# Patient Record
Sex: Female | Born: 1995 | Race: Black or African American | Hispanic: No | Marital: Single | Smoking: Never smoker
Health system: Southern US, Community
[De-identification: ages and names within clinical notes are randomized; demographics above are authoritative.]

## PROBLEM LIST (undated history)

## (undated) DIAGNOSIS — J45909 Unspecified asthma, uncomplicated: Secondary | ICD-10-CM

## (undated) HISTORY — PX: WISDOM TOOTH EXTRACTION: SHX21

---
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
## 2015-09-22 ENCOUNTER — Emergency Department (HOSPITAL_COMMUNITY): Payer: Medicaid Other

## 2015-09-22 ENCOUNTER — Encounter (HOSPITAL_COMMUNITY): Payer: Self-pay | Admitting: Emergency Medicine

## 2015-09-22 ENCOUNTER — Emergency Department (HOSPITAL_COMMUNITY)
Admission: EM | Admit: 2015-09-22 | Discharge: 2015-09-22 | Disposition: A | Discharge status: Home / Self Care | Payer: Medicaid Other | Attending: Emergency Medicine | Admitting: Emergency Medicine

## 2015-09-22 DIAGNOSIS — Z793 Long term (current) use of hormonal contraceptives: Secondary | ICD-10-CM | POA: Insufficient documentation

## 2015-09-22 DIAGNOSIS — Z79899 Other long term (current) drug therapy: Secondary | ICD-10-CM | POA: Insufficient documentation

## 2015-09-22 DIAGNOSIS — R079 Chest pain, unspecified: Secondary | ICD-10-CM | POA: Insufficient documentation

## 2015-09-22 LAB — CBC
HCT: 37.6 % (ref 36.0–46.0)
Hemoglobin: 12 g/dL (ref 12.0–15.0)
MCH: 27.6 pg (ref 26.0–34.0)
MCHC: 31.9 g/dL (ref 30.0–36.0)
MCV: 86.4 fL (ref 78.0–100.0)
PLATELETS: 263 10*3/uL (ref 150–400)
RBC: 4.35 MIL/uL (ref 3.87–5.11)
RDW: 13 % (ref 11.5–15.5)
WBC: 7.1 10*3/uL (ref 4.0–10.5)

## 2015-09-22 LAB — I-STAT TROPONIN, ED: Troponin i, poc: 0 ng/mL (ref 0.00–0.08)

## 2015-09-22 LAB — BASIC METABOLIC PANEL
Anion gap: 7 (ref 5–15)
BUN: 8 mg/dL (ref 6–20)
CALCIUM: 9.1 mg/dL (ref 8.9–10.3)
CO2: 25 mmol/L (ref 22–32)
Chloride: 104 mmol/L (ref 101–111)
Creatinine, Ser: 0.74 mg/dL (ref 0.44–1.00)
GFR calc non Af Amer: >60 mL/min (ref >60)
GLUCOSE: 84 mg/dL (ref 65–99)
Potassium: 3.6 mmol/L (ref 3.5–5.1)
Sodium: 136 mmol/L (ref 135–145)

## 2015-09-22 LAB — D-DIMER, QUANTITATIVE: D-Dimer, Quant: 0.5 ug/mL-FEU (ref 0.00–0.50)

## 2015-09-22 NOTE — ED Provider Notes (Signed)
Complaint of anterior chest pain sharp in quality left-sided parasternal onset yesterday, he replied "heavy breathing.." And intermittent "palpitations" Nothing makes symptoms better or worse. No other associated symptoms. Presently asymptomatic except breathing feels "slightly "heavy" on exam no distress lungs clear auscultation heart regular rhythm no murmurs rubs extremities no edema.  Doug Sou, MD 09/22/15 [phone]

## 2015-09-22 NOTE — Progress Notes (Signed)
Entered in d/c instructions [facility] This is your assigned Medicaid [place] access doctor If you prefer another contact DSS [phone] DSS assigned your doctor *You may receive a bill if you go to any family Dr not assigned to you Medicaid [place] access pcp is [facility] [address] [place], [place] [address] [phone] Medicaid [place] Access Covered Patient If you plan to become a resident of [place] call: [phone] or go to [address] [place], [place] [address] [email or web address] Use this website to assist with understanding your coverage & to renew application As a Medicaid client you MUST contact DSS/SSI each time you change address, move to another  county or another state to keep your address updated [place] Medicaid Transportation to Dr appts if you are have full Medicaid: [phone], [phone]

## 2015-09-22 NOTE — ED Notes (Signed)
Spoke with main lab and they have a saved blue top to run dimer.

## 2015-09-22 NOTE — Discharge Instructions (Signed)
Follow up with your primary care provider in the next 4-5 days for follow up. Please return to the Emergency Department if symptoms worsen or new onset of fever, headache, lightheadedness, dizziness, visual changes, difficulty breathing, palpitations, chest pain, productive cough, vomiting, numbness, tingling, weakness.

## 2015-09-22 NOTE — ED Notes (Signed)
Patient presents for non radiating centralized CP x1 day, described as sharp in nature, rates pain 5/10. No alleviating symptoms. Denies N/V, diaphoresis, lightheadedness or dizziness, numbness of tingling.

## 2015-09-22 NOTE — ED Notes (Signed)
Requested Kathleen Arellano  to inquiry in additional lab was drawn for d imer. Kathleen Arellano will check

## 2015-09-22 NOTE — ED Provider Notes (Signed)
CSN: [id number]     Arrival date & time 09/22/15  1304 History   First MD Initiated Contact with Patient 09/22/15 1556     Chief Complaint  Patient presents with  . Chest Pain     (Consider location/radiation/quality/duration/timing/severity/associated sxs/prior Treatment) HPI   Patient is a 20 year old female with hx of asthma who presents to the ED with complaint of chest pain, onset yesterday. Patient reports when she woke up yesterday morning she noticed she had sharp chest pain to her mid sternal chest wall, denies radiation, denies any aggravating or alleviating factors. She notes her pain started to resolve throughout the day but then notes it returned last night and she noticed it again this morning when she woke up. She also reports having intermittent chest tightness. Denies fever, chills, body aches, diaphoresis, nasal congestion, sore throat, cough, wheezing, palpitations, abdominal pain, nausea, vomiting numbness, tingling or weakness, lightheadedness, dizziness. Patient denies taking any medications prior to arrival. 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 Patient notes her chest pain has resolved since arrival to the ED. Patient notes she has been worked up in the past by a cardiologist at home for palpitations, notes that she wore a cardiac monitor and was advised that her monitor readings where unremarkable. Pt reports her mother has been worked up for palpitations in the past but is currently not on any medications, denies other family hx of heart disease.  History reviewed. No pertinent past medical history. History reviewed. No pertinent past surgical history. No family history on file. Social History  Substance Use Topics  . Smoking status: Never Smoker   . Smokeless tobacco: None  . Alcohol Use: No   OB History    No data available     Review of Systems  Respiratory: Positive for  chest tightness.   Cardiovascular: Positive for chest pain.  All other systems reviewed and are negative.     Allergies  Review of patient's allergies indicates no known allergies.  Home Medications   Prior to Admission medications   Medication Sig Start Date End Date Taking? Authorizing Provider  albuterol (PROVENTIL HFA;VENTOLIN HFA) 108 (90 BASE) MCG/ACT inhaler Inhale 1-2 puffs into the lungs every 6 (six) hours as needed for wheezing or shortness of breath.   Yes Historical Provider, MD  ibuprofen (ADVIL,MOTRIN) 200 MG tablet Take 400 mg by mouth every 6 (six) hours as needed for headache, mild pain or moderate pain.   Yes Historical Provider, MD  norgestimate-ethinyl estradiol (ORTHO-CYCLEN,SPRINTEC,PREVIFEM) 0.25-35 MG-MCG tablet Take 1 tablet by mouth daily.   Yes Historical Provider, MD   BP 114/67 mmHg  Pulse 69  Temp(Src) 98.4 F (36.9 C) (Oral)  Resp 16  Ht  (1.651 m)  Wt 72.576 kg  BMI 26.63 kg/m2  SpO2 100%  LMP 08/14/2015 (Approximate) Physical Exam  Constitutional: She is oriented to person, place, and time. She appears well-developed and well-nourished. No distress.  HENT:  Head: Normocephalic and atraumatic.  Mouth/Throat: Oropharynx is clear and moist. No oropharyngeal exudate.  Eyes: Conjunctivae and EOM are normal. Pupils are equal, round, and reactive to light. Right eye exhibits no discharge. Left eye exhibits no discharge. No scleral icterus.  Neck: Normal range of motion. Neck supple.  Cardiovascular: Normal rate, regular rhythm, normal heart sounds and intact distal pulses.   Pulmonary/Chest: Effort normal and breath sounds normal. No respiratory distress. She has no wheezes.  She has no rales. She exhibits no tenderness.  Abdominal: Soft. Bowel sounds are normal. She exhibits no distension and no mass. There is no tenderness. There is no rebound and no guarding.  Musculoskeletal: Normal range of motion. She exhibits no edema.  Lymphadenopathy:     She has no cervical adenopathy.  Neurological: She is alert and oriented to person, place, and time.  Skin: Skin is warm and dry. She is not diaphoretic.  Nursing note and vitals reviewed.   ED Course  Procedures (including critical care time) Labs Review Labs Reviewed  BASIC METABOLIC PANEL  CBC  D-DIMER, QUANTITATIVE (NOT AT [facility])  Rosezena Sensor, ED    Imaging Review Dg Chest 2 View  09/22/2015  CLINICAL DATA:  Chest pain, heavy breathing starting yesterday, history of exercise induced asthma EXAM: CHEST  2 VIEW COMPARISON:  None. FINDINGS: The heart size and mediastinal contours are within normal limits. Both lungs are clear. The visualized skeletal structures are unremarkable. IMPRESSION: No active cardiopulmonary disease. Electronically Signed   By: Natasha Mead M.D.   On: 09/22/2015 14:14   I have personally reviewed and evaluated these images and lab results as part of my medical decision-making.   EKG Interpretation   Date/Time:  Thursday September 22 2015 13:28:56 EST Ventricular Rate:  78 PR Interval:  171 QRS Duration: 70 QT Interval:  359 QTC Calculation: 409 R Axis:   91 Text Interpretation:  Sinus rhythm Borderline right axis deviation  Baseline wander in lead(s) I aVR No old tracing to compare Confirmed by  Ethelda Chick  MD, SAM [phone]) on 09/22/2015 4:53:48 PM      MDM   Final diagnoses:  Chest pain, unspecified chest pain type    Patient presents with midsternal chest pain. Patient reports her chest pain has resolved since arrival to the ED. VSS. Exam unremarkable. EKG shows sinus rhythm, no prior tracings to compare. Troponin negative. Labs unremarkable. Pt reports she is on OCPs, unable to [facility], will order d-dimer to evaluate for PE. D-dimer negative. HEART score 1. I have a low suspicion for ACS, PE, dissection, or other acute cardiac event at this time and do not feel any further workup or imaging is warranted. Discussed results and plan for discharge with  patient. Advised patient to follow up with her cardiologist when she is home next week.  Evaluation does not show pathology requring ongoing emergent intervention or admission. Pt is hemodynamically stable and mentating appropriately. Discussed findings/results and plan with patient/guardian, who agrees with plan. All questions answered. Return precautions discussed and outpatient follow up given.        Satira Sark [place], [place] 09/22/15 1804  Doug Sou, MD 09/23/15 [phone]

## 2016-08-21 ENCOUNTER — Ambulatory Visit (HOSPITAL_COMMUNITY)
Admission: EM | Admit: 2016-08-21 | Discharge: 2016-08-21 | Disposition: A | Discharge status: Home / Self Care | Payer: Medicaid Other | Attending: Family Medicine | Admitting: Family Medicine

## 2016-08-21 ENCOUNTER — Encounter (HOSPITAL_COMMUNITY): Payer: Self-pay | Admitting: Emergency Medicine

## 2016-08-21 DIAGNOSIS — B9789 Other viral agents as the cause of diseases classified elsewhere: Secondary | ICD-10-CM | POA: Diagnosis not present

## 2016-08-21 DIAGNOSIS — R110 Nausea: Secondary | ICD-10-CM | POA: Diagnosis not present

## 2016-08-21 DIAGNOSIS — H6123 Impacted cerumen, bilateral: Secondary | ICD-10-CM | POA: Diagnosis not present

## 2016-08-21 DIAGNOSIS — J069 Acute upper respiratory infection, unspecified: Secondary | ICD-10-CM | POA: Diagnosis not present

## 2016-08-21 HISTORY — DX: Unspecified asthma, uncomplicated: J45.909

## 2016-08-21 MED ORDER — ONDANSETRON 4 MG PO TBDP: 4.0000 mg | ORAL_TABLET | Freq: Three times a day (TID) | ORAL | 0 refills | Status: AC | PRN

## 2016-08-21 NOTE — Discharge Instructions (Signed)
You most likely have a viral URI, I advise rest, plenty of fluids and management of symptoms with over the counter medicines. For symptoms you may take Tylenol as needed every 4-6 hours for body aches or fever, not to exceed 4,000 mg a day, Take mucinex or mucinex DM ever 12 hours with a full glass of water, you may use an inhaled steroid such as Flonase, 2 sprays each nostril once a day for congestion, or an antihistamine such as Claritin or Zyrtec once a day. Should your symptoms worsen or fail to resolve, follow up with your primary care provider or return to clinic.   In addition to the instructions listed above, I have prescribed Zofran for nausea, take one tablet under the tongue every 8 hours as needed for nausea.

## 2016-08-21 NOTE — ED Provider Notes (Signed)
CSN: [id number]     Arrival date & time 08/21/16  1004 History   First MD Initiated Contact with Patient 08/21/16 1058     Chief Complaint  Patient presents with  . Headache  . Sore Throat   (Consider location/radiation/quality/duration/timing/severity/associated sxs/prior Treatment) 21 year old female persents to clinic with 24 hour complaint of headache, nausea, and sore throat, along with headache. Headache is bilateral, no light or sound sensitivity, dull and achy, No body aches, no muscle aches, no fever, vomiting, or diarrhea.   The history is provided by the patient.  Headache  Sore Throat  Associated symptoms include headaches.    Past Medical History:  Diagnosis Date  . Asthma    exercise induced   Past Surgical History:  Procedure Laterality Date  . WISDOM TOOTH EXTRACTION Bilateral    History reviewed. No pertinent family history. Social History  Substance Use Topics  . Smoking status: Never Smoker  . Smokeless tobacco: Never Used  . Alcohol use No   OB History    No data available     Review of Systems  Reason unable to perform ROS: as covered in HPI.  Neurological: Positive for headaches.  All other systems reviewed and are negative.   Allergies  Patient has no known allergies.  Home Medications   Prior to Admission medications   Medication Sig Start Date End Date Taking? Authorizing Provider  albuterol (PROVENTIL HFA;VENTOLIN HFA) 108 (90 BASE) MCG/ACT inhaler Inhale 1-2 puffs into the lungs every 6 (six) hours as needed for wheezing or shortness of breath.   Yes Historical Provider, MD  ibuprofen (ADVIL,MOTRIN) 200 MG tablet Take 400 mg by mouth every 6 (six) hours as needed for headache, mild pain or moderate pain.   Yes Historical Provider, MD  norgestimate-ethinyl estradiol (ORTHO-CYCLEN,SPRINTEC,PREVIFEM) 0.25-35 MG-MCG tablet Take 1 tablet by mouth daily.   Yes Historical Provider, MD  ondansetron (ZOFRAN ODT) 4 MG disintegrating tablet Take 1  tablet (4 mg total) by mouth every 8 (eight) hours as needed for nausea or vomiting. 08/21/16   Dorena Bodo, NP   Meds Ordered and Administered this Visit  Medications - No data to display  BP 109/71 (BP Location: Right Arm)   Pulse 76   Temp 98.3 F (36.8 C) (Oral)   LMP 08/18/2016 (Exact Date)   SpO2 99%  No data found.   Physical Exam  Constitutional: She is oriented to person, place, and time. She appears well-developed and well-nourished. She does not have a sickly appearance. She does not appear ill. No distress.  HENT:  Head: Normocephalic and atraumatic.  Right Ear: External ear normal.  Left Ear: External ear normal.  Nose: Nose normal. Right sinus exhibits no maxillary sinus tenderness and no frontal sinus tenderness. Left sinus exhibits no maxillary sinus tenderness and no frontal sinus tenderness.  Mouth/Throat: Uvula is midline, oropharynx is clear and moist and mucous membranes are normal. No oropharyngeal exudate, posterior oropharyngeal edema or posterior oropharyngeal erythema. Tonsils are 2+ on the right. Tonsils are 2+ on the left. No tonsillar exudate.  Bilateral cerumen impaction  Eyes: Pupils are equal, round, and reactive to light.  Neck: Normal range of motion. Neck supple. No JVD present.  Cardiovascular: Normal rate and regular rhythm.   Pulmonary/Chest: Effort normal and breath sounds normal. No respiratory distress. She has no wheezes.  Abdominal: Soft. Bowel sounds are normal. She exhibits no distension. There is no tenderness. There is no guarding.  Lymphadenopathy:    She has no  cervical adenopathy.  Neurological: She is alert and oriented to person, place, and time.  Skin: Skin is warm and dry. Capillary refill takes less than 2 seconds. She is not diaphoretic.  Psychiatric: She has a normal mood and affect.  Nursing note and vitals reviewed.   Urgent Care Course     Procedures (including critical care time)  Labs Review Labs Reviewed - No  data to display  Imaging Review No results found.   Visual Acuity Review  Right Eye Distance:   Left Eye Distance:   Bilateral Distance:    Right Eye Near:   Left Eye Near:    Bilateral Near:         MDM   1. Viral URI   2. Nausea   3. Bilateral impacted cerumen    Ears reevaluated after cleaning, TM normal, no signs of infection  You most likely have a viral URI, I advise rest, plenty of fluids and management of symptoms with over the counter medicines. For symptoms you may take Tylenol as needed every 4-6 hours for body aches or fever, not to exceed 4,000 mg a day, Take mucinex or mucinex DM ever 12 hours with a full glass of water, you may use an inhaled steroid such as Flonase, 2 sprays each nostril once a day for congestion, or an antihistamine such as Claritin or Zyrtec once a day. Should your symptoms worsen or fail to resolve, follow up with your primary care provider or return to clinic.   In addition to the instructions listed above, I have prescribed Zofran for nausea, take one tablet under the tongue every 8 hours as needed for nausea.      Dorena BodoLawrence Lou Irigoyen, NP 08/21/16 1122

## 2016-08-21 NOTE — ED Triage Notes (Signed)
Pt has been suffering from a headache since Sunday that has not been relieved by 600 mg of ibuprofen.  She reports a sore throat that started yesterday.  She denies any fever, or any URI symptoms.

## 2016-09-05 ENCOUNTER — Ambulatory Visit: Payer: Medicaid Other | Attending: Medical | Admitting: Physical Therapy

## 2016-09-05 ENCOUNTER — Encounter: Payer: Self-pay | Admitting: Physical Therapy

## 2016-09-05 DIAGNOSIS — M25562 Pain in left knee: Secondary | ICD-10-CM | POA: Insufficient documentation

## 2016-09-05 DIAGNOSIS — M6281 Muscle weakness (generalized): Secondary | ICD-10-CM | POA: Diagnosis present

## 2016-09-05 DIAGNOSIS — M25561 Pain in right knee: Secondary | ICD-10-CM | POA: Diagnosis present

## 2016-09-05 DIAGNOSIS — M5442 Lumbago with sciatica, left side: Secondary | ICD-10-CM | POA: Insufficient documentation

## 2016-09-05 DIAGNOSIS — G8929 Other chronic pain: Secondary | ICD-10-CM | POA: Diagnosis present

## 2016-09-07 NOTE — Therapy (Addendum)
[facility] [address] [place], [place], [address] Phone: [phone]   Fax:  [phone]  Physical Therapy Evaluation  Patient Details  Name: Kathleen Arellano MRN: [id number] Date of Birth: [birth year] Referring Provider: Marin Olp [facility]   Encounter Date: 09/05/2016      PT End of Session - 09/10/16 2028    Visit Number 2   Number of Visits 16   Date for PT Re-Evaluation 10/31/16   Authorization Type medicaid    PT Start Time 0802   PT Stop Time 0845   PT Time Calculation (min) 43 min   Activity Tolerance Patient tolerated treatment well   Behavior During Therapy [facility] for tasks assessed/performed      Past Medical History:  Diagnosis Date  . Asthma    exercise induced    Past Surgical History:  Procedure Laterality Date  . WISDOM TOOTH EXTRACTION Bilateral     There were no vitals filed for this visit.       Subjective Assessment - 09/10/16 0813    Subjective Patient reports  her lower back has been about the same. Her pain level is about a 6-7/10. She has been oding her exercises. She continues to have pain in her knees as well.    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 No limit    How long can you walk comfortably? pain with community distances in the knees and back when the back is exacerbated    Diagnostic tests x-ray knee: Nothing significant per patient    Patient Stated Goals To have less pain    Currently in Pain? Yes   Pain Score 8    Pain Location Back   Pain Orientation Left   Pain Descriptors / Indicators Aching   Pain Type Chronic pain   Pain Onset More than a month ago   Pain Frequency Intermittent   Aggravating Factors  running and walking    Pain Relieving Factors rest   Effect of Pain on Daily Activities difficulty going to the gym    Pain Score 6   Pain Location Knee   Pain Orientation Left;Mid   Pain Descriptors / Indicators Aching   Pain Type Chronic pain   Pain Onset More than a month ago   Pain Frequency Intermittent   Aggravating Factors  standing, walking    Pain Relieving Factors rest   Effect of Pain on Daily Activities difficulty walking around the campus                                PT Education - 09/10/16 2024    Education provided Yes   Education Details symptom mangement    Person(s) Educated Patient   Methods Explanation;Demonstration   Comprehension Verbalized understanding;Returned demonstration;Verbal cues required;Tactile cues required          PT Short Term Goals - 09/05/16 1039      PT SHORT TERM GOAL #1   Title Patient will demonstrate good core strength    Baseline fair strength    Time 4   Period Weeks   Status New     PT SHORT TERM GOAL #2   Title Patient will be independent with Mconnell taping if effective    Baseline unknown    Time 4   Period Weeks   Status New  PT SHORT TERM GOAL #3   Title Patient will be independent with initial HEP    Baseline unknown, given basic on eval    Time 4   Period Weeks   Status New     PT SHORT TERM GOAL #4   Title Patient will demonstrate 5/5 gross bilateral hip strength    Baseline 4/5    Time 4   Period Weeks   Status New           PT Long Term Goals - 09/06/16 1656      PT LONG TERM GOAL #1   Title Patient will return to the gym with a program for strengthening and stretching    Baseline unable, unknown    Time 8   Period Weeks   Status New     PT LONG TERM GOAL #2   Title Patient will demonstrate a 38% limitation on FOTO    Baseline 57%   Time 8   Period Weeks   Status New     PT LONG TERM GOAL #3   Title Patient will walk 10 min without increased pain    Baseline Unable to walk 10 min without pain currently    Time 8   Period Weeks   Status New               Plan - 09/10/16 2029    Clinical Impression Statement Patient  countinued to have in her lower back with minimal activity. therapy tried multiple mobilizations but could not get a cavitation. Therapy reviewed taping with the patient to improved patellar stabilization. She hasd pain with posterior hip mobilization but improved pain free flexion.    PT Frequency 2x / week   PT Duration 8 weeks   PT Treatment/Interventions ADLs/Self Care Home Management;Cryotherapy;Electrical Stimulation;Iontophoresis 4mg /ml Dexamethasone;Stair training;Gait training;Moist Heat;Ultrasound;Traction;Therapeutic activities;Therapeutic exercise;Neuromuscular re-education;Patient/family education;Passive range of motion;Manual techniques;Dry needling;Taping   PT Next Visit Plan Give patient bridging, SLR 3 way, SAQ, try mconnel taping both knees, soft tissue mobilization to the left upper glut/ lumbar spine. Has some pisnching in the front ogf the left hip with flexion. Patient may require posteriro or anmterior hip mobilizations. Consdier posterior pelvic tilt. Assess tolerance to mobilizations and patellar taping    PT Home Exercise Plan had pain lyin on her back: sidelying clam, prayer stretch and lateral prayer piriformis / glut set    Consulted and Agree with Plan of Care Patient      Patient will benefit from skilled therapeutic intervention in order to improve the following deficits and impairments:  Pain, Decreased activity tolerance, Decreased strength, Increased muscle spasms  Visit Diagnosis: Chronic left-sided low back pain with left-sided sciatica - Plan: PT plan of care cert/re-cert  Chronic pain of left knee - Plan: PT plan of care cert/re-cert  Chronic pain of right knee - Plan: PT plan of care cert/re-cert  Muscle weakness (generalized) - Plan: PT plan of care cert/re-cert     Problem List There are no active problems to display for this patient.   PAA,JENNIFER PT DPT  09/11/2016, 11:59 AM  [facility] [address] [place], [place], [id number] Phone: [phone]   Fax:  [phone]  Name: Kathleen Arellano MRN: [id number] Date of Birth: [birth year]

## 2016-09-10 ENCOUNTER — Encounter: Payer: Self-pay | Admitting: Physical Therapy

## 2016-09-10 ENCOUNTER — Ambulatory Visit: Payer: Medicaid Other | Admitting: Physical Therapy

## 2016-09-10 DIAGNOSIS — M6281 Muscle weakness (generalized): Secondary | ICD-10-CM

## 2016-09-10 DIAGNOSIS — M25562 Pain in left knee: Secondary | ICD-10-CM

## 2016-09-10 DIAGNOSIS — M25561 Pain in right knee: Secondary | ICD-10-CM

## 2016-09-10 DIAGNOSIS — G8929 Other chronic pain: Secondary | ICD-10-CM

## 2016-09-10 DIAGNOSIS — M5442 Lumbago with sciatica, left side: Principal | ICD-10-CM

## 2016-09-10 NOTE — Therapy (Signed)
[facility] [address] [place], [place], [address] Phone: [phone]   Fax:  [phone]  Physical Therapy Treatment  Patient Details  Name: Kathleen Arellano MRN: [id number] Date of Birth: 08/04/1995 Referring Provider: Marin Olp [facility]   Encounter Date: 09/10/2016      PT End of Session - 09/10/16 2028    Visit Number 2   Number of Visits 16   Date for PT Re-Evaluation 10/31/16   Authorization Type medicaid    PT Start Time 0802   PT Stop Time 0845   PT Time Calculation (min) 43 min   Activity Tolerance Patient tolerated treatment well   Behavior During Therapy [facility] for tasks assessed/performed      Past Medical History:  Diagnosis Date  . Asthma    exercise induced    Past Surgical History:  Procedure Laterality Date  . WISDOM TOOTH EXTRACTION Bilateral     There were no vitals filed for this visit.      Subjective Assessment - 09/10/16 0813    Subjective Patient reports  her lower back has been about the same. Her pain level is about a 6-7/10. She has been oding her exercises. She continues to have pain in her knees as well.    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 No limit    How long can you walk comfortably? pain with community distances in the knees and back when the back is exacerbated    Diagnostic tests x-ray knee: Nothing significant per patient    Patient Stated Goals To have less pain    Currently in Pain? Yes   Pain Score 8    Pain Location Back   Pain Orientation Left   Pain Descriptors / Indicators Aching   Pain Type Chronic pain   Pain Onset More than a month ago   Pain Frequency Intermittent   Aggravating Factors  running and walking    Pain Relieving Factors rest   Effect of Pain on Daily Activities difficulty going to the gym    Pain Score 6   Pain Location Knee   Pain Orientation Left;Mid   Pain  Descriptors / Indicators Aching   Pain Type Chronic pain   Pain Onset More than a month ago   Pain Frequency Intermittent   Aggravating Factors  standing, walking    Pain Relieving Factors rest   Effect of Pain on Daily Activities difficulty walking around the campus                          [facility] Adult PT Treatment/Exercise - 09/10/16 0001      Self-Care   Self-Care Other Self-Care Comments   Other Self-Care Comments  reviewed mconnell taping. patint able to perfrom taping with min cuing after therapy demsotrates on her right knee.      Lumbar Exercises: Supine   Other Supine Lumbar Exercises Piriformis stretch held 2nd to pinching feeling in her hip.    Other Supine Lumbar Exercises Bridging2x10 with cuing for abdominal brace; Staight lef raise 2x10 bilateral;     Knee/Hip Exercises: Seated   Long Arc Quad Limitations seated LAQ yellow 2x10 in range without crepitus      Manual Therapy   Manual therapy comments Posterior hip mobilization. Difficult to find position without pain. Hip grade 5 mobilization 2x with no cavitation. Grade 3 and 4  long axis mobilization with foot external rotation, shotgun mobilization.                 PT Education - 09/10/16 2024    Education provided Yes   Education Details symptom mangement    Person(s) Educated Patient   Methods Explanation;Demonstration   Comprehension Verbalized understanding;Returned demonstration;Verbal cues required;Tactile cues required          PT Short Term Goals - 09/05/16 1039      PT SHORT TERM GOAL #1   Title Patient will demsotrate good core strength    Time 4   Period Weeks   Status New     PT SHORT TERM GOAL #2   Title Patient will be indepdendent with Mconnel taping if effective    Time 4   Period Weeks   Status New     PT SHORT TERM GOAL #3   Title Patient will be independent with initial HEP    Time 4   Period Weeks   Status New     PT SHORT TERM GOAL #4   Title  Patient will demsotrate 5/5 gross bilateral hip strength    Time 4   Period Weeks   Status New           PT Long Term Goals - 09/06/16 1656      PT LONG TERM GOAL #1   Title Patient will return to the gym with a program for strengthening and stretching    Time 8   Period Weeks   Status New     PT LONG TERM GOAL #2   Title Patient will demonstrate a 38% limitation on FOTO    Time 8   Period Weeks   Status New     PT LONG TERM GOAL #3   Title Patient will walk 10 miles without increased pain    Time 8   Period Weeks   Status New               Plan - 09/10/16 2029    Clinical Impression Statement Patient countinued to have in her lower back with minimal activity. therapy tried multiple mobilizations but could not get a cavitation. Therapy reviewed taping with the patient to improved patellar stabilization. She hasd pain with posterior hip mobilization but improved pain free flexion.    PT Frequency 2x / week   PT Duration 8 weeks   PT Treatment/Interventions ADLs/Self Care Home Management;Cryotherapy;Electrical Stimulation;Iontophoresis 4mg /ml Dexamethasone;Stair training;Gait training;Moist Heat;Ultrasound;Traction;Therapeutic activities;Therapeutic exercise;Neuromuscular re-education;Patient/family education;Passive range of motion;Manual techniques;Dry needling;Taping   PT Next Visit Plan Give patient bridging, SLR 3 way, SAQ, try mconnel taping both knees, soft tissue mobilization to the left upper glut/ lumbar spine. Has some pisnching in the front ogf the left hip with flexion. Patient may require posteriro or anmterior hip mobilizations. Consdier posterior pelvic tilt. Assess tolerance to mobilizations and patellar taping    PT Home Exercise Plan had pain lyin on her back: sidelying clam, prayer stretch and lateral prayer piriformis / glut set    Consulted and Agree with Plan of Care Patient      Patient will benefit from skilled therapeutic intervention in order  to improve the following deficits and impairments:  Pain, Decreased activity tolerance, Decreased strength, Increased muscle spasms  Visit Diagnosis: Chronic left-sided low back pain with left-sided sciatica  Chronic pain of left knee  Chronic pain of right knee  Muscle weakness (generalized)     Problem List There are no active problems  to display for this patient.   Dessie Coma PT DPT  09/10/2016, 8:39 PM  [facility] [address] [place], [place], [address] Phone: [phone]   Fax:  [phone]  Name: Kathleen Arellano MRN: [id number] Date of Birth: [birth year]

## 2016-09-12 ENCOUNTER — Ambulatory Visit: Payer: Medicaid Other | Admitting: Physical Therapy

## 2016-09-12 DIAGNOSIS — M5442 Lumbago with sciatica, left side: Secondary | ICD-10-CM | POA: Diagnosis not present

## 2016-09-12 DIAGNOSIS — G8929 Other chronic pain: Secondary | ICD-10-CM

## 2016-09-12 DIAGNOSIS — M6281 Muscle weakness (generalized): Secondary | ICD-10-CM

## 2016-09-12 DIAGNOSIS — M25562 Pain in left knee: Secondary | ICD-10-CM

## 2016-09-12 DIAGNOSIS — M25561 Pain in right knee: Secondary | ICD-10-CM

## 2016-09-12 NOTE — Therapy (Signed)
[place], [place], [address] Phone: [phone]   Fax:  [phone]  Physical Therapy Treatment  Patient Details  Name: Valincia Touch MRN: [id number] Date of Birth: [birth year] Referring Provider: [facility] L[facility]   Encounter Date: 09/12/2016      PT End of Session - 09/12/16 0901    Visit Number 3   Number of Visits 16   Date for PT Re-Evaluation 10/31/16   PT Start Time 0802   PT Stop Time 0900   PT Time Calculation (min) 58 min   Activity Tolerance Patient tolerated treatment well   Behavior During Therapy [facility] for tasks assessed/performed      Past Medical History:  Diagnosis Date  . Asthma    exercise induced    Past Surgical History:  Procedure Laterality Date  . WISDOM TOOTH EXTRACTION Bilateral     There were no vitals filed for this visit.      Subjective Assessment - 09/12/16 1150    Subjective Patient reports  her lower back has been about the same. Her pain level is about a 6-7/10. She has been doing her exercises. She continues to have pain in her knees as well.   tape is helpful.  I want to look on [employer] to see If I have picked the correct tape.    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 No limit    How long can you walk comfortably? pain with community distances in the knees and back when the back is exacerbated    Diagnostic tests x-ray knee: Nothing significant per patient    Patient Stated Goals To have less pain    Currently in Pain? Yes   Pain Score 7    Pain Location Back   Pain Orientation Left   Pain Type Chronic pain   Pain Onset More than a month ago   Pain Frequency Intermittent   Aggravating Factors  running walking moving certain ways   Pain Relieving Factors rest, change of position   Effect of Pain on Daily Activities difficulty with gym workout   Pain Score 7   Pain  Location Knee   Pain Orientation Right;Mid   Pain Descriptors / Indicators Aching  pops   Pain Type Chronic pain   Aggravating Factors  standing walking   Pain Relieving Factors rest   Effect of Pain on Daily Activities difficulty walking around campus                         [facility] Adult PT Treatment/Exercise - 09/12/16 0001      Self-Care   Other Self-Care Comments  correct tape info on [employer],  education anatomy knee,  ADL handout for posture     Lumbar Exercises: Sidelying   Clam Limitations yellow band up to 2 sets of 10.  cued  for position.  Difficult to do when fatigued.      Lumbar Exercises: Quadruped   Straight Leg Raises Limitations 10-20 reps stopped when fatigue monitored for technique  HEP,  also did with bent knee,  cues,, Lt hip fatigued and had a slight strangs sensation so stopped      Knee/Hip Exercises: Standing   Other Standing Knee Exercises Mini squats with 2 step side to side sets, yellow band doubled,  cues initially.  HEP     Knee/Hip Exercises: Supine  Short Arc [employer] 10 reps;2 sets  0, 5 LBS Left   Short Arc Quad Sets Limitations POps without increased pain   Single Leg Bridge --  Almost 2 sets,  stopped when her form failed     Cryotherapy   Number Minutes Cryotherapy 10 Minutes   Cryotherapy Location Knee   Type of Cryotherapy --  cold pack, leg elevated                PT Education - 09/12/16 0901    Education provided Yes   Education Details ADL handout,  HEP   Person(s) Educated Patient   Methods Explanation;Tactile cues;Verbal cues;Handout;Demonstration   Comprehension Verbalized understanding;Returned demonstration;Need further instruction  ADL not reviewed          PT Short Term Goals - 09/12/16 1211      PT SHORT TERM GOAL #1   Title Patient will demonstrate good core strength    Time 4   Period Weeks   Status Unable to assess     PT SHORT TERM GOAL #2   Title Patient will be independent with  Mconnell taping if effective    Baseline not tested,  No questions about it when asked   Time 4   Period Weeks     PT SHORT TERM GOAL #3   Title Patient will be independent with initial HEP    Baseline continue to build initial HEP   Time 4   Period Weeks   Status On-going     PT SHORT TERM GOAL #4   Title Patient will demonstrate 5/5 gross bilateral hip strength    Time 4   Period Weeks   Status Unable to assess           PT Long Term Goals - 09/06/16 1656      PT LONG TERM GOAL #1   Title Patient will return to the gym with a program for strengthening and stretching    Baseline unable, unknown    Time 8   Period Weeks   Status New     PT LONG TERM GOAL #2   Title Patient will demonstrate a 38% limitation on FOTO    Baseline 57%   Time 8   Period Weeks   Status New     PT LONG TERM GOAL #3   Title Patient will walk 10 min without increased pain    Baseline Unable to walk 10 min without pain currently    Time 8   Period Weeks   Status New               Plan - 09/12/16 6861    Clinical Impression Statement progress troward HEP.  Patient plans ordering tape from [employer].  Small amount of tape issued so she can tape until tape arrives.  Intermittant exercise burning noted today with 1 X brief  "sensation" Lt leg with quadriped weightbearing,  mild.  No new goals met,  No new Objective findings noted today.   PT Next Visit Plan Review Hip strengthening execises. Give patient , SLR 3 way, SAQ,  check to see if any questions about  mcConnel taping both knees, soft tissue mobilization to the left upper glut/ lumbar spine. Has some pisnching in the front ogf the left hip with flexion. Patient may require posteriro or anmterior hip mobilizations. Consdier posterior pelvic tilt. Assess tolerance to mobilizations and patellar taping    PT Home Exercise Plan  sidelying clam, prayer stretch and lateral prayer  piriformis / glut set ,  Minisquat side steps with yellow band,   single leg bridges, quadriped straight and bent knee lifts,    Consulted and Agree with Plan of Care Patient      Patient will benefit from skilled therapeutic intervention in order to improve the following deficits and impairments:     Visit Diagnosis: Chronic left-sided low back pain with left-sided sciatica  Chronic pain of left knee  Chronic pain of right knee  Muscle weakness (generalized)     Problem List There are no active problems to display for this patient.   HARRIS,KAREN PTA 09/12/2016, 12:12 PM  [place], [place], [address] Phone: [phone]   Fax:  [phone]  Name: Terrilynn Postell MRN: [id number] Date of Birth: Mar 24, 1996

## 2016-09-12 NOTE — Patient Instructions (Addendum)

## 2016-09-14 ENCOUNTER — Ambulatory Visit: Payer: Medicaid Other | Admitting: Physical Therapy

## 2016-09-17 ENCOUNTER — Ambulatory Visit: Payer: Medicaid Other | Admitting: Physical Therapy

## 2016-09-17 ENCOUNTER — Encounter: Payer: Self-pay | Admitting: Physical Therapy

## 2016-09-17 DIAGNOSIS — M5442 Lumbago with sciatica, left side: Principal | ICD-10-CM

## 2016-09-17 DIAGNOSIS — M25561 Pain in right knee: Secondary | ICD-10-CM

## 2016-09-17 DIAGNOSIS — M25562 Pain in left knee: Secondary | ICD-10-CM

## 2016-09-17 DIAGNOSIS — M6281 Muscle weakness (generalized): Secondary | ICD-10-CM

## 2016-09-17 DIAGNOSIS — G8929 Other chronic pain: Secondary | ICD-10-CM

## 2016-09-17 NOTE — Therapy (Signed)
[facility] [address] [place], [place], [address] Phone: [phone]   Fax:  [phone]  Physical Therapy Treatment  Patient Details  Name: Kathleen Arellano MRN: [id number] Date of Birth: [birth year] Referring Provider: Marin Olp [facility]   Encounter Date: 09/17/2016      PT End of Session - 09/17/16 0821    Visit Number 4   Number of Visits 16   Date for PT Re-Evaluation 10/31/16   Authorization Type medicaid    PT Start Time 0800   PT Stop Time 0853   PT Time Calculation (min) 53 min   Activity Tolerance Patient tolerated treatment well   Behavior During Therapy [facility] for tasks assessed/performed      Past Medical History:  Diagnosis Date  . Asthma    exercise induced    Past Surgical History:  Procedure Laterality Date  . WISDOM TOOTH EXTRACTION Bilateral     There were no vitals filed for this visit.      Subjective Assessment - 09/17/16 0817    Subjective Patient reports some improvement with lowetr back pain but her knees have been somewhat worse. She has not been able to tape her knees. She reports shooting pain into the right knee at times. she reports sometimes the left hurts more, sometimes the left hurts worse.    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 No limit    How long can you walk comfortably? pain with community distances in the knees and back when the back is exacerbated    Diagnostic tests x-ray knee: Nothing significant per patient    Patient Stated Goals To have less pain    Currently in Pain? Yes   Pain Score 5    Pain Location Back   Pain Orientation Left   Pain Descriptors / Indicators Aching   Pain Type Chronic pain   Pain Onset More than a month ago   Pain Frequency Intermittent   Aggravating Factors  running , ewalking, moving certain ways   Pain Relieving Factors rest, changing position    Effect  of Pain on Daily Activities difficulty with gym workout    Multiple Pain Sites Yes   Pain Score 5   Pain Location Knee   Pain Orientation Right   Pain Descriptors / Indicators Aching   Pain Type Chronic pain   Pain Onset More than a month ago   Pain Frequency Intermittent   Aggravating Factors  standing and walking    Pain Relieving Factors rest    Effect of Pain on Daily Activities difficulty walking                          [facility] Adult PT Treatment/Exercise - 09/17/16 0001      Self-Care   Other Self-Care Comments  Taped noth knees and educated the patient on proper taping technique.      Lumbar Exercises: Supine   Other Supine Lumbar Exercises Bridging2x10 with cuing for abdominal brace; Staight lef raise 2x10 bilateral;;      Lumbar Exercises: Sidelying   Clam Limitations yellow band up to 2 sets of 10.  cued  for position.  Difficult to do when fatigued.      Lumbar Exercises: Quadruped   Straight Leg Raises Limitations 2x10 1lb each 1x10 SLR with ER      Knee/Hip Exercises: Standing   Other  Standing Knee Exercises Eccentric step downs 2x10; Cone drill for ecentric knee control 2x10 each leg,      Cryotherapy   Number Minutes Cryotherapy 10 Minutes   Cryotherapy Location Knee   Type of Cryotherapy Ice pack                PT Education - 09/17/16 0819    Education provided Yes   Education Details ADL handout, HEP    Person(s) Educated Patient   Methods Explanation;Demonstration;Tactile cues;Verbal cues   Comprehension Verbalized understanding;Returned demonstration;Need further instruction          PT Short Term Goals - 09/12/16 1211      PT SHORT TERM GOAL #1   Title Patient will demonstrate good core strength    Time 4   Period Weeks   Status Unable to assess     PT SHORT TERM GOAL #2   Title Patient will be independent with Mconnell taping if effective    Baseline not tested,  No questions about it when asked   Time 4    Period Weeks     PT SHORT TERM GOAL #3   Title Patient will be independent with initial HEP    Baseline continue to build initial HEP   Time 4   Period Weeks   Status On-going     PT SHORT TERM GOAL #4   Title Patient will demonstrate 5/5 gross bilateral hip strength    Time 4   Period Weeks   Status Unable to assess           PT Long Term Goals - 09/06/16 1656      PT LONG TERM GOAL #1   Title Patient will return to the gym with a program for strengthening and stretching    Baseline unable, unknown    Time 8   Period Weeks   Status New     PT LONG TERM GOAL #2   Title Patient will demonstrate a 38% limitation on FOTO    Baseline 57%   Time 8   Period Weeks   Status New     PT LONG TERM GOAL #3   Title Patient will walk 10 min without increased pain    Baseline Unable to walk 10 min without pain currently    Time 8   Period Weeks   Status New               Plan - 09/17/16 1610    Clinical Impression Statement Patient continues to have pain with basic core stabilization exercises. She reports her pain reached cloise to a 9/10 with SLR.     Rehab Potential Good   PT Frequency 2x / week   PT Duration 8 weeks   PT Treatment/Interventions ADLs/Self Care Home Management;Cryotherapy;Electrical Stimulation;Iontophoresis 4mg /ml Dexamethasone;Stair training;Gait training;Moist Heat;Ultrasound;Traction;Therapeutic activities;Therapeutic exercise;Neuromuscular re-education;Patient/family education;Passive range of motion;Manual techniques;Dry needling;Taping   PT Next Visit Plan Review Hip strengthening execises. Give patient , SLR 3 way, SAQ,  check to see if any questions about  mcConnel taping both knees, soft tissue mobilization to the left upper glut/ lumbar spine. Has some pisnching in the front ogf the left hip with flexion. Patient may require posteriro or anmterior hip mobilizations. Consdier posterior pelvic tilt. Assess tolerance to mobilizations and patellar  taping    PT Home Exercise Plan  sidelying clam, prayer stretch and lateral prayer piriformis / glut set ,  Minisquat side steps with yellow band,  single leg bridges, quadriped straight and  bent knee lifts,    Consulted and Agree with Plan of Care Patient      Patient will benefit from skilled therapeutic intervention in order to improve the following deficits and impairments:  Pain, Decreased activity tolerance, Decreased strength, Increased muscle spasms  Visit Diagnosis: Chronic left-sided low back pain with left-sided sciatica  Chronic pain of left knee  Chronic pain of right knee  Muscle weakness (generalized)     Problem List There are no active problems to display for this patient.   Dessie ComaDavid J Vena Bassinger 09/17/2016, 12:24 PM  [facility] [address] [place], [place], [address] Phone: [phone]   Fax:  [phone]  Name: Kathleen Arellano MRN: [id number] Date of Birth: [birth year]

## 2016-09-21 ENCOUNTER — Encounter: Payer: Self-pay | Admitting: Physical Therapy

## 2016-09-21 ENCOUNTER — Ambulatory Visit: Payer: Medicaid Other | Attending: Medical | Admitting: Physical Therapy

## 2016-09-21 DIAGNOSIS — M25561 Pain in right knee: Secondary | ICD-10-CM | POA: Insufficient documentation

## 2016-09-21 DIAGNOSIS — M5442 Lumbago with sciatica, left side: Secondary | ICD-10-CM | POA: Insufficient documentation

## 2016-09-21 DIAGNOSIS — G8929 Other chronic pain: Secondary | ICD-10-CM

## 2016-09-21 DIAGNOSIS — M6281 Muscle weakness (generalized): Secondary | ICD-10-CM

## 2016-09-21 DIAGNOSIS — M25562 Pain in left knee: Secondary | ICD-10-CM | POA: Insufficient documentation

## 2016-09-21 NOTE — Therapy (Signed)
[facility] [address] [place], [place], [address] Phone: [phone]   Fax:  [phone]  Physical Therapy Treatment  Patient Details  Name: Kathleen AlvineRhema Netanya Arellano MRN: [id number] Date of Birth: 1996/02/03 Referring Provider: [facility]   Encounter Date: 09/21/2016      PT End of Session - 09/21/16 0941    Visit Number 5   Number of Visits 16   Date for PT Re-Evaluation 10/31/16   Authorization Type medicaid    PT Start Time 0930   PT Stop Time 1015   PT Time Calculation (min) 45 min   Activity Tolerance Patient tolerated treatment well   Behavior [facility] for tasks assessed/performed      Past Medical History:  Diagnosis Date  . Asthma    exercise induced    Past Surgical History:  Procedure Laterality Date  . WISDOM TOOTH EXTRACTION Bilateral     There were no vitals filed for this visit.      Subjective Assessment - 09/21/16 0938    Subjective Patient reports her back has not been as sore fver the past few days. Her knees were sore yesterday but she feels like it may be the weather.    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 No limit    How long can you walk comfortably? pain with community distances in the knees and back when the back is exacerbated    Diagnostic tests x-ray knee: Nothing significant per patient    Patient Stated Goals To have less pain    Currently in Pain? No/denies   Multiple Pain Sites No                         [facility] Adult PT Treatment/Exercise - 09/21/16 0001      Lumbar Exercises: Supine   Other Supine Lumbar Exercises Bridging2x10 with cuing for abdominal brace with red band hip abduction ; Staight lef raise 2x10 bilateral; SLR with ER 2x10; Double knee to chest 2x10;      Lumbar Exercises: Sidelying   Clam Limitations yellow band up to 2 sets of 10.  cued  for  position.  Difficult to do when fatigued.      Lumbar Exercises: Quadruped   Other Quadruped Lumbar Exercises alt ue/le 2x5      Manual Therapy   Manual therapy comments Posetrior and anterior hip mobilizations with no improvement in pain. Difficult to find an area that is not paingful. Hip mobilizations reporduce low back pain. Attempoted soft tissue mobilization of her lumbar spie but was unable to locate area of spasming.                 PT Education - 09/21/16 0940    Education provided Yes   Education Details improtance of core strengthening    Person(s) Educated Patient   Methods Explanation;Demonstration;Tactile cues;Verbal cues   Comprehension Verbalized understanding;Returned demonstration;Need further instruction          PT Short Term Goals - 09/21/16 0945      PT SHORT TERM GOAL #1   Title Patient will demonstrate good core strength    Baseline fair strength    Time 4   Period Weeks   Status On-going     PT SHORT TERM GOAL #2   Title Patient will be independent with Mconnell taping if effective  Baseline not tested,  No questions about it when asked   Time 4   Period Weeks   Status On-going     PT SHORT TERM GOAL #3   Title Patient will be independent with initial HEP    Baseline continue to build initial HEP   Time 4   Period Weeks   Status On-going     PT SHORT TERM GOAL #4   Title Patient will demonstrate 5/5 gross bilateral hip strength    Baseline 4/5    Time 4   Period Weeks   Status On-going           PT Long Term Goals - 09/06/16 1656      PT LONG TERM GOAL #1   Title Patient will return to the gym with a program for strengthening and stretching    Baseline unable, unknown    Time 8   Period Weeks   Status New     PT LONG TERM GOAL #2   Title Patient will demonstrate a 38% limitation on FOTO    Baseline 57%   Time 8   Period Weeks   Status New     PT LONG TERM GOAL #3   Title Patient will walk 10 min without  increased pain    Baseline Unable to walk 10 min without pain currently    Time 8   Period Weeks   Status New               Plan - 09/21/16 1096    Clinical Impression Statement Patient continues to have reportduction of her pain with hip mobility. She has pinching oin the front of her hip with activity. She had a positive hip anterior labrum sign. She reports her knees come and go. She was adivsed to continue her strengthening and stretching.  If it is not at least improving more by next week consider sending her back to her MD,    Rehab Potential Good   PT Frequency 2x / week   PT Duration 8 weeks   PT Treatment/Interventions ADLs/Self Care Home Management;Cryotherapy;Electrical Stimulation;Iontophoresis 4mg /ml Dexamethasone;Stair training;Gait training;Moist Heat;Ultrasound;Traction;Therapeutic activities;Therapeutic exercise;Neuromuscular re-education;Patient/family education;Passive range of motion;Manual techniques;Dry needling;Taping   PT Next Visit Plan Review Hip strengthening execises. Give patient , SLR 3 way, SAQ,  check to see if any questions about  mcConnel taping both knees, soft tissue mobilization to the left upper glut/ lumbar spine. Has some pisnching in the front ogf the left hip with flexion. Patient may require posteriro or anmterior hip mobilizations. Consdier posterior pelvic tilt. Assess tolerance to mobilizations and patellar taping. Add vcone drill to HEP and eccentric step down if patient tolerated well.     PT Home Exercise Plan  sidelying clam, prayer stretch and lateral prayer piriformis / glut set ,  Minisquat side steps with yellow band,  single leg bridges, quadriped straight and bent knee lifts,    Consulted and Agree with Plan of Care Patient      Patient will benefit from skilled therapeutic intervention in order to improve the following deficits and impairments:  Pain, Decreased activity tolerance, Decreased strength, Increased muscle spasms  Visit  Diagnosis: Chronic left-sided low back pain with left-sided sciatica  Chronic pain of left knee  Chronic pain of right knee  Muscle weakness (generalized)     Problem List There are no active problems to display for this patient.   Dessie Coma PT DPT  09/21/2016, 1:03 PM  [facility] [address] [place], [place], [address] Phone: [phone]   Fax:  [phone]  Name: Erion Hermans MRN: [id number] Date of Birth: August 12, 1995

## 2016-09-24 ENCOUNTER — Encounter: Payer: Medicaid Other | Admitting: Physical Therapy

## 2016-09-28 ENCOUNTER — Encounter: Payer: Medicaid Other | Admitting: Physical Therapy

## 2016-10-01 ENCOUNTER — Encounter: Payer: Self-pay | Admitting: Physical Therapy

## 2016-10-01 ENCOUNTER — Ambulatory Visit: Payer: Medicaid Other | Admitting: Physical Therapy

## 2016-10-01 ENCOUNTER — Encounter: Payer: Medicaid Other | Admitting: Physical Therapy

## 2016-10-01 DIAGNOSIS — M5442 Lumbago with sciatica, left side: Principal | ICD-10-CM

## 2016-10-01 DIAGNOSIS — M25561 Pain in right knee: Secondary | ICD-10-CM

## 2016-10-01 DIAGNOSIS — G8929 Other chronic pain: Secondary | ICD-10-CM

## 2016-10-01 DIAGNOSIS — M6281 Muscle weakness (generalized): Secondary | ICD-10-CM

## 2016-10-01 DIAGNOSIS — M25562 Pain in left knee: Secondary | ICD-10-CM

## 2016-10-01 NOTE — Patient Instructions (Addendum)
From exercise drawer Transversus abdominus 10 X  3-5 X And with single leg clam 10 X daily

## 2016-10-01 NOTE — Therapy (Signed)
[facility] [address] [place], [place], [address] Phone: [phone]   Fax:  [phone]  Physical Therapy Treatment  Patient Details  Name: Kathleen Arellano MRN: [id number] Date of Birth: June 27, 1996 Referring Provider: [facility]   Encounter Date: 10/01/2016      PT End of Session - 10/01/16 0939    Visit Number 6   Number of Visits 16   Date for PT Re-Evaluation 10/31/16   PT Start Time 0847   PT Stop Time 0944   PT Time Calculation (min) 57 min   Behavior During Therapy [facility] for tasks assessed/performed      Past Medical History:  Diagnosis Date  . Asthma    exercise induced    Past Surgical History:  Procedure Laterality Date  . WISDOM TOOTH EXTRACTION Bilateral     There were no vitals filed for this visit.      Subjective Assessment - 10/01/16 0850    Subjective Left knee hurts,  Low back hurts   Currently in Pain? Yes   Pain Score 7    Pain Location Back   Pain Orientation Mid;Lower   Pain Descriptors / Indicators --  pressure   Pain Type Chronic pain   Aggravating Factors  in car over a pot holes. sitting legs out to tape knees.   home exercises , running.     Pain Relieving Factors rest   Effect of Pain on Daily Activities difficulty  gym routine   Pain Score 0  up to 9/0   Pain Location Knee   Pain Orientation Right;Left   Pain Descriptors / Indicators Aching   Pain Radiating Towards has pinches with SLR   Pain Frequency Intermittent   Aggravating Factors  cold weather,  SLR,  squats   Pain Relieving Factors does not do   Effect of Pain on Daily Activities difficulty exercising ,  walking                         [facility] Adult PT Treatment/Exercise - 10/01/16 0001      Lumbar Exercises: Supine   Other Supine Lumbar Exercises transversue abdominus  10 X,  single leg .  HEP clam,  double leg clam,  cues,  HEP     Lumbar Exercises: Quadruped   Other Quadruped  Lumbar Exercises Multifitus press into 2 pillows,  press with knee flexion 5 x each     Knee/Hip Exercises: Stretches   LobbyistQuad Stretch Right;3 reps;30 seconds   Quad Stretch Limitations tender proximal rectus,  gentle manuat to quads to soften     Moist Heat Therapy   Number Minutes Moist Heat 15 Minutes   Moist Heat Location Lumbar Spine     Manual Therapy   Manual Therapy Soft tissue mobilization   Manual therapy comments lumbar muscles,  upper gluteals non tight,  soft tissue work anterior hip tender , gentle soft tissue work to soften.                  PT Education - 10/01/16 603 [id number]    Education provided Yes   Education Details HEP.   Person(s) Educated Patient   Methods Explanation;Demonstration;Tactile cues;Verbal cues;Handout   Comprehension Verbalized understanding;Returned demonstration          PT Short Term Goals - 10/01/16 0945      PT SHORT TERM GOAL #1   Title Patient will demonstrate good core strength    Baseline fair,  needs cues to become aware of need of activation   Time 4   Period Weeks   Status On-going     PT SHORT TERM GOAL #2   Title Patient will be independent with Mconnell taping if effective    Baseline Minimal cues fore washing off lotion prior to tape application   Time 4   Period Weeks   Status On-going     PT SHORT TERM GOAL #3   Title Patient will be independent with initial HEP    Baseline continue to build initial HEP   Time 4   Period Weeks   Status On-going     PT SHORT TERM GOAL #4   Title Patient will demonstrate 5/5 gross bilateral hip strength    Time 4   Period Weeks   Status Unable to assess           PT Long Term Goals - 09/06/16 1656      PT LONG TERM GOAL #1   Title Patient will return to the gym with a program for strengthening and stretching    Baseline unable, unknown    Time 8   Period Weeks   Status New     PT LONG TERM GOAL #2   Title Patient will demonstrate a 38% limitation on FOTO     Baseline 57%   Time 8   Period Weeks   Status New     PT LONG TERM GOAL #3   Title Patient will walk 10 min without increased pain    Baseline Unable to walk 10 min without pain currently    Time 8   Period Weeks   Status New               Plan - 10/01/16 1610    Clinical Impression Statement Pain in low back more central now.  Little lasting progress with back / knee pains.  Focus today on stabilization with hip activity. progress toward HEP goals.  No new objective findings.    PT Next Visit Plan Review Hip strengthening execises. Give patient , SLR 3 way, SAQ,  check to see if any questions about  mcConnel taping both knees, soft tissue mobilization to the left upper glut/ lumbar spine. Has some pisnching in the front ogf the left hip with flexion. Patient may require posteriro or anmterior hip mobilizations. Consdier posterior pelvic tilt. Assess tolerance to mobilizations and patellar taping. Add vcone drill to HEP and eccentric step down if patient tolerated well.     PT Home Exercise Plan  sidelying clam, prayer stretch and lateral prayer piriformis / glut set ,  Minisquat side steps with yellow band,  single leg bridges, quadriped straight and bent knee lifts, Transversus abdominus   Recommended Other Services Per Lorayne Bender PT,  May call Dr Caswell Corwin for referral for hip .    Consulted and Agree with Plan of Care Patient      Patient will benefit from skilled therapeutic intervention in order to improve the following deficits and impairments:  Pain, Decreased activity tolerance, Decreased strength, Increased muscle spasms  Visit Diagnosis: Chronic left-sided low back pain with left-sided sciatica  Chronic pain of left knee  Chronic pain of right knee  Muscle weakness (generalized)     Problem List There are no active problems to display for this patient.   Gustave Lindeman PTA 10/01/2016, 9:50 AM  [facility] [address] [place], [place], [address] Phone: [phone]  Fax:  [phone]  Name: Kathleen Arellano MRN: [id number] Date of Birth: 05-28-1996

## 2016-10-05 ENCOUNTER — Ambulatory Visit: Payer: Medicaid Other | Admitting: Physical Therapy

## 2016-10-05 ENCOUNTER — Encounter: Payer: Self-pay | Admitting: Physical Therapy

## 2016-10-05 DIAGNOSIS — M5442 Lumbago with sciatica, left side: Secondary | ICD-10-CM | POA: Diagnosis not present

## 2016-10-05 DIAGNOSIS — M25562 Pain in left knee: Secondary | ICD-10-CM

## 2016-10-05 DIAGNOSIS — G8929 Other chronic pain: Secondary | ICD-10-CM

## 2016-10-05 DIAGNOSIS — M25561 Pain in right knee: Secondary | ICD-10-CM

## 2016-10-05 DIAGNOSIS — M6281 Muscle weakness (generalized): Secondary | ICD-10-CM

## 2016-10-05 NOTE — Therapy (Signed)
[facility] [address] [place], [place], [address] Phone: [phone]   Fax:  [phone]  Physical Therapy Treatment  Patient Details  Name: Kathleen Arellano MRN: [id number] Date of Birth: 1995-09-07 Referring Provider: [facility]achel [facility]   Encounter Date: 10/05/2016      PT End of Session - 10/05/16 1233    Visit Number 7   Number of Visits 16   Date for PT Re-Evaluation 10/31/16   Authorization Type medicaid    PT Start Time 0930   PT Stop Time 1013   PT Time Calculation (min) 43 min   Activity Tolerance Patient tolerated treatment well   Behavior During Therapy [facility] for tasks assessed/performed      Past Medical History:  Diagnosis Date  . Asthma    exercise induced    Past Surgical History:  Procedure Laterality Date  . WISDOM TOOTH EXTRACTION Bilateral     There were no vitals filed for this visit.      Subjective Assessment - 10/05/16 0939    Subjective Patient tried working out and had pain in her knees. She has no pain this morning. She reports the pain can come on with certain movements.    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 No limit    How long can you walk comfortably? pain with community distances in the knees and back when the back is exacerbated    Diagnostic tests x-ray knee: Nothing significant per patient    Patient Stated Goals To have less pain    Currently in Pain? No/denies                         [facility] Treatment/Exercise - 10/05/16 0001      Lumbar Exercises: Supine   Other Supine Lumbar Exercises Bridging2x10 with cuing for abdominal brace with red band hip abduction ; Staight lef raise 2x10 bilateral; SLR with ER 2x10; Double knee to chest 2x10;      Lumbar Exercises: Sidelying   Clam Limitations yellow band up to 2 sets of 10.  cued  for position.  Difficult to do when  fatigued.      Lumbar Exercises: Quadruped   Other Quadruped Lumbar Exercises --     Knee/Hip Exercises: Stretches   LobbyistQuad Stretch Right;3 reps;30 seconds   Quad Stretch Limitations tender proximal rectus,  gentle manuat to quads to soften     Knee/Hip Exercises: Standing   Other Standing Knee Exercises Eccentric step downs 2x10; [facility] drill for ecentric knee control 2x10 each leg, D2  flexion with single leg stance 5kgx10 each leg; eccentric leg 2x10 each leg 60 lbs.      Moist Heat Therapy   Number Minutes Moist Heat --   Moist Heat Location --     Cryotherapy   Number Minutes Cryotherapy 10 Minutes   Cryotherapy Location Knee   Type of Cryotherapy Ice pack     Manual Therapy   Manual Therapy Soft tissue mobilization   Manual therapy comments lumbar muscles,  upper gluteals non tight,  soft tissue work anterior hip tender , gentle soft tissue work to soften.                  PT Education - 10/05/16 1233    Education provided Yes   Education Details HEP    Person(s) Educated Patient  Methods Explanation;Demonstration;Tactile cues;Verbal cues   Comprehension Verbalized understanding;Returned demonstration          PT Short Term Goals - 10/05/16 1236      PT SHORT TERM GOAL #1   Title Patient will demonstrate good core strength    Baseline fair, needs cues to become aware of need of activation   Time 4   Period Weeks   Status On-going     PT SHORT TERM GOAL #2   Title Patient will be independent with Mconnell taping if effective    Baseline Minimal cues fore washing off lotion prior to tape application   Time 4   Period Weeks   Status On-going     PT SHORT TERM GOAL #3   Title Patient will be independent with initial HEP    Baseline continue to build initial HEP   Time 4   Period Weeks   Status On-going     PT SHORT TERM GOAL #4   Title Patient will demonstrate 5/5 gross bilateral hip strength    Baseline 4/5    Time 4   Period Weeks   Status  On-going           PT Long Term Goals - 09/06/16 1656      PT LONG TERM GOAL #1   Title Patient will return to the gym with a program for strengthening and stretching    Baseline unable, unknown    Time 8   Period Weeks   Status New     PT LONG TERM GOAL #2   Title Patient will demonstrate a 38% limitation on FOTO    Baseline 57%   Time 8   Period Weeks   Status New     PT LONG TERM GOAL #3   Title Patient will walk 10 min without increased pain    Baseline Unable to walk 10 min without pain currently    Time 8   Period Weeks   Status New               Plan - 10/05/16 1235    Clinical Impression Statement Patient has an appointment with Dr Caswell Corwin. Therapy worked on [employer] stabilization program. she was advised to stick to these exercises and not to do too many squats at this time. She may be sore she was advised to ice. Cpontinue to work on Acupuncturist program.    Rehab Potential Good   PT Frequency 2x / week   PT Duration 8 weeks   PT Treatment/Interventions ADLs/Self Care Home Management;Cryotherapy;Electrical Stimulation;Iontophoresis 4mg /ml Dexamethasone;Stair training;Gait training;Moist Heat;Ultrasound;Traction;Therapeutic activities;Therapeutic exercise;Neuromuscular re-education;Patient/family education;Passive range of motion;Manual techniques;Dry needling;Taping   PT Next Visit Plan Review Hip strengthening execises. Give patient , SLR 3 way, SAQ,  check to see if any questions about  mcConnel taping both knees, soft tissue mobilization to the left upper glut/ lumbar spine. Has some pisnching in the front ogf the left hip with flexion. Patient may require posteriro or anmterior hip mobilizations. Consdier posterior pelvic tilt. Assess tolerance to mobilizations and patellar taping. Add vcone drill to HEP and eccentric step down if patient tolerated well.     PT Home Exercise Plan  sidelying clam, prayer stretch and lateral prayer piriformis / glut set ,   Minisquat side steps with yellow band,  single leg bridges, quadriped straight and bent knee lifts, Transversus abdominus   Consulted and Agree with Plan of Care Patient      Patient will benefit from skilled therapeutic  intervention in order to improve the following deficits and impairments:  Pain, Decreased activity tolerance, Decreased strength, Increased muscle spasms  Visit Diagnosis: Chronic left-sided low back pain with left-sided sciatica  Chronic pain of left knee  Chronic pain of right knee  Muscle weakness (generalized)     Problem List There are no active problems to display for this patient.   Dessie Coma PT DPT  10/05/2016, 12:41 PM  [facility] [address] [place], [place], [address] Phone: [phone]   Fax:  [phone]  Name: Kathleen Arellano MRN: [id number] Date of Birth: [birth year]

## 2016-10-08 ENCOUNTER — Encounter: Payer: Self-pay | Admitting: Physical Therapy

## 2016-10-08 ENCOUNTER — Ambulatory Visit: Payer: Medicaid Other | Admitting: Physical Therapy

## 2016-10-08 DIAGNOSIS — G8929 Other chronic pain: Secondary | ICD-10-CM

## 2016-10-08 DIAGNOSIS — M6281 Muscle weakness (generalized): Secondary | ICD-10-CM

## 2016-10-08 DIAGNOSIS — M25561 Pain in right knee: Secondary | ICD-10-CM

## 2016-10-08 DIAGNOSIS — M25562 Pain in left knee: Secondary | ICD-10-CM

## 2016-10-08 DIAGNOSIS — M5442 Lumbago with sciatica, left side: Secondary | ICD-10-CM | POA: Diagnosis not present

## 2016-10-08 NOTE — Therapy (Signed)
[place], [place], [address] Phone: [phone]   Fax:  [phone]  Physical Therapy Treatment  Patient Details  Name: Kathleen Arellano MRN: [id number] Date of Birth: Feb 17, 1996 Referring Provider: Dolores Lory [facility]   Encounter Date: 10/08/2016      PT End of Session - 10/08/16 0939    Visit Number 8   Number of Visits 16   Date for PT Re-Evaluation 10/31/16   PT Start Time 8786   PT Stop Time 0947   PT Time Calculation (min) 60 min   Activity Tolerance Patient tolerated treatment well   Behavior During Therapy [facility] for tasks assessed/performed      Past Medical History:  Diagnosis Date  . Asthma    exercise induced    Past Surgical History:  Procedure Laterality Date  . WISDOM TOOTH EXTRACTION Bilateral     There were no vitals filed for this visit.      Subjective Assessment - 10/08/16 0852    Subjective Knee pain with some exercises.     Currently in Pain? Yes   Pain Score 5    Pain Location Back   Pain Orientation Mid;Lower;Right;Left   Pain Type Chronic pain   Pain Frequency Intermittent   Aggravating Factors  laying on hard surfaces.  Hitting pot holes (@ weeks ago)  playing tug of war  Rope on the left.    Pain Relieving Factors rest   Effect of Pain on Daily Activities ADL's difficult.  limits   Pain Score 3  up to 8/10   Pain Location Knee   Pain Orientation Right;Left  right more lately   Pain Descriptors / Indicators Aching   Pain Type Chronic pain   Pain Frequency Intermittent   Aggravating Factors  walking longer time. (more than 10-15 minutes)  Cold weather   Pain Relieving Factors Change the irritating activity   Effect of Pain on Daily Activities ibuprophen            [facility] PT Assessment - 10/08/16 0001      Observation/Other Assessments   Observations --  30.5 cm girth both mid patella                     [facility] Adult PT  Treatment/Exercise - 10/08/16 0001      Lumbar Exercises: Sidelying   Clam 20 reps   Clam Limitations blue band     Lumbar Exercises: Quadruped   Straight Leg Raises Limitations 20 X cued to isolate hip extension vs lifting with low back  each     Knee/Hip Exercises: Stretches   Passive Hamstring Stretch 3 reps;30 seconds  each   Quad Stretch Right;3 reps;30 seconds  and left     Knee/Hip Exercises: Standing   Side Lunges 5 reps;5 sets  2  stepa, cues,  upgrade tp blue band   Lateral Step Up 1 set;10 reps  4 inched,  cued for foot, thigh position,  still pain tingle     Knee/Hip Exercises: Seated   Long Arc Quad 5 reps  10 second holds,  eccentric lowering.     Long Arc Quad Weight 5 lbs.   Long Arc Quad Limitations 8/10 pain RT,  6/10 LT knee 3 reps with weight 2 reps No pain.  AA extension by PTA for all reps.       Knee/Hip Exercises: Supine   Single Leg Bridge 20 reps  each  6-7/10 pinch anterior hips,  eases with lowering leg      Cryotherapy   Number Minutes Cryotherapy 10 Minutes   Cryotherapy Location Knee   Type of Cryotherapy --  cold packs,  elevated, both                  PT Short Term Goals - 10/08/16 9381      PT SHORT TERM GOAL #1   Title Patient will demonstrate good core strength    Time 4   Period Weeks   Status Unable to assess     PT SHORT TERM GOAL #2   Title Patient will be independent with Mconnell taping if effective    Baseline independent   Time 4   Period Weeks   Status Achieved     PT SHORT TERM GOAL #3   Title Patient will be independent with initial HEP    Baseline needs cues for hip strengthening   Time 4   Period Weeks   Status On-going     PT SHORT TERM GOAL #4   Title Patient will demonstrate 5/5 gross bilateral hip strength    Time 4   Period Weeks   Status Unable to assess           PT Long Term Goals - 09/06/16 1656      PT LONG TERM GOAL #1   Title Patient will return to the gym with a program  for strengthening and stretching    Baseline unable, unknown    Time 8   Period Weeks   Status New     PT LONG TERM GOAL #2   Title Patient will demonstrate a 38% limitation on FOTO    Baseline 57%   Time 8   Period Weeks   Status New     PT LONG TERM GOAL #3   Title Patient will walk 10 min without increased pain    Baseline Unable to walk 10 min without pain currently    Time 8   Period Weeks   Status New               Plan - 10/08/16 0175    Clinical Impression Statement Patient continues to need cues for correct technique with hip strengthening.  Her feet pronate with weight bearing causing increased lteral tracking forces.  Instructions given for correct techniques.  Pain levels reported during exercises are not demonstrated by changes in body language.   STG#2 met.  Girth both knees 39.5 cm.   PT Next Visit Plan Continue to Review Hip strengthening execises. Give patient , SLR 3 way, SAQ,   soft tissue mobilization to the left upper glut/ lumbar spine. Has some pisnching in the front ogf the left hip with flexion. Patient may require posteriro or anmterior hip mobilizations. Consdier posterior pelvic tilt. Assess tolerance to mobilizations and patellar taping. Add vcone drill to HEP and eccentric step down if patient tolerated well.     PT Home Exercise Plan  sidelying clam, prayer stretch and lateral prayer piriformis / glut set ,  Minisquat side steps with yellow band,  single leg bridges, quadriped straight and bent knee lifts, Transversus abdominus   Consulted and Agree with Plan of Care Patient      Patient will benefit from skilled therapeutic intervention in order to improve the following deficits and impairments:  Pain, Decreased activity tolerance, Decreased strength, Increased muscle spasms  Visit Diagnosis: Chronic left-sided low back pain with left-sided sciatica  Chronic pain of left knee  Chronic pain of right knee  Muscle weakness  (generalized)     Problem List There are no active problems to display for this patient.   HARRIS,KAREN PTA 10/08/2016, 9:45 AM  [facility] [address] [place], [place], [address] Phone: [phone]   Fax:  [phone]  Name: Kathleen Arellano MRN: [id number] Date of Birth: [birth year]

## 2016-10-12 ENCOUNTER — Encounter: Payer: Medicaid Other | Admitting: Physical Therapy

## 2016-10-12 ENCOUNTER — Ambulatory Visit: Payer: Medicaid Other | Admitting: Physical Therapy

## 2016-10-12 DIAGNOSIS — G8929 Other chronic pain: Secondary | ICD-10-CM

## 2016-10-12 DIAGNOSIS — M25562 Pain in left knee: Secondary | ICD-10-CM

## 2016-10-12 DIAGNOSIS — M5442 Lumbago with sciatica, left side: Principal | ICD-10-CM

## 2016-10-12 DIAGNOSIS — M6281 Muscle weakness (generalized): Secondary | ICD-10-CM

## 2016-10-12 DIAGNOSIS — M25561 Pain in right knee: Secondary | ICD-10-CM

## 2016-10-12 NOTE — Therapy (Addendum)
[place], [place], [address] Phone: [phone]   Fax:  [phone]  Physical Therapy Treatment/ Discharge  Patient Details  Name: Kathleen Arellano MRN: [id number] Date of Birth: 1995/11/24 Referring Provider: Dolores Lory [facility]   Encounter Date: 10/12/2016      PT End of Session - 10/12/16 0905    Visit Number 9   Number of Visits 16   Date for PT Re-Evaluation 10/31/16   Authorization Type medicaid    PT Start Time 0847   PT Stop Time 0940   PT Time Calculation (min) 53 min   Activity Tolerance Patient tolerated treatment well   Behavior During Therapy [facility] for tasks assessed/performed      Past Medical History:  Diagnosis Date  . Asthma    exercise induced    Past Surgical History:  Procedure Laterality Date  . WISDOM TOOTH EXTRACTION Bilateral     There were no vitals filed for this visit.      Subjective Assessment - 10/12/16 0945    Subjective (P)  Patient reported asignificant pain after the last visit. She had pain for about 2 days. Her bck has felt pretty good for the past few days.    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 (P)  No limit    How long can you walk comfortably? (P)  pain with community distances in the knees and back when the back is exacerbated    Diagnostic tests (P)  x-ray knee: Nothing significant per patient    Patient Stated Goals (P)  To have less pain    Currently in Pain? (P)  Yes   Pain Score (P)  3    Pain Location (P)  Knee   Pain Orientation (P)  Right   Pain Descriptors / Indicators (P)  Aching   Pain Type (P)  Chronic pain   Pain Onset (P)  More than a month ago   Pain Frequency (P)  Intermittent   Aggravating Factors  (P)  exercises    Pain Relieving Factors (P)  rest and ice                          [facility] Adult PT Treatment/Exercise -  10/12/16 0001      Lumbar Exercises: Supine   Other Supine Lumbar Exercises Bridging2x10 with cuing for abdominal brace with red band hip abduction ; Staight lef raise 2x10 bilateral 2lb ;; Double knee to chest 2x10;      Knee/Hip Exercises: Stretches   Passive Hamstring Stretch 3 reps;30 seconds  each   Quad Stretch Right;3 reps;30 seconds  and left     Knee/Hip Exercises: Standing   Side Lunges --  2  stepa, cues,  upgrade tp blue band   Other Standing Knee Exercises Eccentric step downs 2x10; [facility] drill for ecentric knee control 2x10 each leg, D2  flexion with single leg stance 5kgx10 each leg; eccentric leg 2x10 each leg 60 lbs.    Other Standing Knee Exercises lateral band walk green 2x10      Knee/Hip Exercises: Seated   Long Arc Quad Limitations dont do open chain with high weights 2nd to increased pain                   PT Short Term Goals - 10/12/16 1219  PT SHORT TERM GOAL #1   Title Patient will demonstrate good core strength    Baseline able to activiate    Time 4   Period Weeks   Status Achieved     PT SHORT TERM GOAL #2   Title Patient will be independent with Mconnell taping if effective    Baseline independent   Time 4   Period Weeks   Status Achieved     PT SHORT TERM GOAL #3   Title Patient will be independent with initial HEP    Baseline needs cues for hip strengthening   Time 4   Period Weeks   Status Achieved     PT SHORT TERM GOAL #4   Title Patient will demonstrate 5/5 gross bilateral hip strength    Baseline continues to have some hip weakness    Time 4   Period Weeks   Status On-going           PT Long Term Goals - 10/12/16 1220      PT LONG TERM GOAL #1   Title Patient will return to the gym with a program for strengthening and stretching    Baseline unable, unknown    Time 8   Period Weeks   Status On-going     PT LONG TERM GOAL #2   Title Patient will demonstrate a 38% limitation on FOTO    Baseline 41 %  limitation    Time 8   Period Weeks   Status On-going     PT LONG TERM GOAL #3   Title Patient will walk 10 min without increased pain    Baseline Unable to walk 10 min without pain currently    Time 8   Period Weeks   Status On-going               Plan - 10/12/16 1218    Clinical Impression Statement Patient continues to have some pain with strengthening but has less pain with closed chain knee strengthening. She feels comfortable with her program. Her back is only hurting intemrittently. She will continue her tratment on her onw.    Rehab Potential Good   PT Frequency 2x / week   PT Duration 8 weeks   PT Treatment/Interventions ADLs/Self Care Home Management;Cryotherapy;Electrical Stimulation;Iontophoresis 30m/ml Dexamethasone;Stair training;Gait training;Moist Heat;Ultrasound;Traction;Therapeutic activities;Therapeutic exercise;Neuromuscular re-education;Patient/family education;Passive range of motion;Manual techniques;Dry needling;Taping   PT Next Visit Plan Continue to Review Hip strengthening execises. Give patient , SLR 3 way, SAQ,   soft tissue mobilization to the left upper glut/ lumbar spine. Has some pisnching in the front ogf the left hip with flexion. Patient may require posteriro or anmterior hip mobilizations. Consdier posterior pelvic tilt. Assess tolerance to mobilizations and patellar taping. Add vcone drill to HEP and eccentric step down if patient tolerated well.     PT Home Exercise Plan  sidelying clam, prayer stretch and lateral prayer piriformis / glut set ,  Minisquat side steps with yellow band,  single leg bridges, quadriped straight and bent knee lifts, Transversus abdominus   Consulted and Agree with Plan of Care Patient      Patient will benefit from skilled therapeutic intervention in order to improve the following deficits and impairments:  Pain, Decreased activity tolerance, Decreased strength, Increased muscle spasms  Visit Diagnosis: Chronic  left-sided low back pain with left-sided sciatica  Chronic pain of left knee  Chronic pain of right knee  Muscle weakness (generalized)  PHYSICAL THERAPY DISCHARGE SUMMARY  Visits from Start of  Care: 9  Current functional level related to goals / functional outcomes: intermittent back pain and continued knee pain but has exercises to improve with time    Remaining deficits: As stated above   Education / Equipment: HEP Plan: Patient agrees to discharge.  Patient goals were not met. Patient is being discharged due to meeting the stated rehab goals.  ?????       Problem List There are no active problems to display for this patient.   Carney Living PT DPT  10/12/2016, 12:22 PM  [facility] [address] [place], [place], [address] Phone: [phone]   Fax:  [phone]  Name: Nirvi Boehler MRN: [id number] Date of Birth: [birth year]

## 2017-08-30 ENCOUNTER — Encounter (HOSPITAL_COMMUNITY): Payer: Self-pay

## 2017-08-30 ENCOUNTER — Emergency Department (HOSPITAL_COMMUNITY): Payer: Self-pay

## 2017-08-30 ENCOUNTER — Emergency Department (HOSPITAL_COMMUNITY)
Admission: EM | Admit: 2017-08-30 | Discharge: 2017-08-31 | Disposition: A | Discharge status: Home / Self Care | Payer: Self-pay | Attending: Emergency Medicine | Admitting: Emergency Medicine

## 2017-08-30 ENCOUNTER — Other Ambulatory Visit: Payer: Self-pay

## 2017-08-30 DIAGNOSIS — R05 Cough: Secondary | ICD-10-CM | POA: Insufficient documentation

## 2017-08-30 DIAGNOSIS — R509 Fever, unspecified: Secondary | ICD-10-CM | POA: Insufficient documentation

## 2017-08-30 DIAGNOSIS — B349 Viral infection, unspecified: Secondary | ICD-10-CM | POA: Insufficient documentation

## 2017-08-30 DIAGNOSIS — J3489 Other specified disorders of nose and nasal sinuses: Secondary | ICD-10-CM | POA: Insufficient documentation

## 2017-08-30 DIAGNOSIS — R0981 Nasal congestion: Secondary | ICD-10-CM | POA: Insufficient documentation

## 2017-08-30 DIAGNOSIS — R5081 Fever presenting with conditions classified elsewhere: Secondary | ICD-10-CM

## 2017-08-30 LAB — I-STAT BETA HCG BLOOD, ED (MC, WL, AP ONLY): I-stat hCG, quantitative: <5 m[IU]/mL (ref <5)

## 2017-08-30 LAB — RAPID STREP SCREEN (MED CTR MEBANE ONLY): Streptococcus, Group A Screen (Direct): NEGATIVE

## 2017-08-30 MED ORDER — IBUPROFEN 400 MG PO TABS: 600.0000 mg | ORAL_TABLET | Freq: Once | ORAL | Status: DC

## 2017-08-30 MED ORDER — ACETAMINOPHEN 325 MG PO TABS
650.0000 mg | ORAL_TABLET | Freq: Once | ORAL | Status: AC | PRN
  Administered 2017-08-30: 650 mg via ORAL
  Filled 2017-08-30: qty 2

## 2017-08-30 NOTE — ED Provider Notes (Addendum)
[facility] EMERGENCY [facility] Provider Note  CSN: [id number] Arrival date & time: 08/30/17 1256  Chief Complaint(s) URI and Fever  HPI Kathleen Arellano is a 22 y.o. female    URI   This is a new problem. Episode onset: 4 days. The problem has not changed since onset.The maximum temperature recorded prior to her arrival was 102 to 102.9 F. Associated symptoms include congestion, rhinorrhea and cough. Pertinent negatives include no abdominal pain, no diarrhea, no nausea, no vomiting, no sinus pain and no sore throat. She has tried other medications for the symptoms.  Fever   Associated symptoms include congestion and cough. Pertinent negatives include no diarrhea, no vomiting and no sore throat.   Endorses possible sick contacts at work.  Past Medical History Past Medical History:  Diagnosis Date  . Asthma    exercise induced   There are no active problems to display for this patient.  Home Medication(s) Prior to Admission medications   Medication Sig Start Date End Date Taking? Authorizing Provider  ibuprofen (ADVIL,MOTRIN) 200 MG tablet Take 400 mg by mouth every 6 (six) hours as needed for headache, mild pain or moderate pain.   Yes [provider]  norgestimate-ethinyl estradiol (ORTHO-CYCLEN,SPRINTEC,PREVIFEM) 0.25-35 MG-MCG tablet Take 1 tablet by mouth daily.   Yes [provider]  Nutritional Supplements (COLD AND FLU PO) Take 30 mLs by mouth at bedtime as needed.   Yes [provider]  albuterol (PROVENTIL HFA;VENTOLIN HFA) 108 (90 BASE) MCG/ACT inhaler Inhale 1-2 puffs into the lungs every 6 (six) hours as needed for wheezing or shortness of breath.    [provider]  ondansetron (ZOFRAN ODT) 4 MG disintegrating tablet Take 1 tablet (4 mg total) by mouth every 8 (eight) hours as needed for nausea or vomiting. Patient not taking: Reported on 09/05/2016 08/21/16   Dorena BodoKennard, Lawrence, NP                                                                                                        Past Surgical History Past Surgical History:  Procedure Laterality Date  . WISDOM TOOTH EXTRACTION Bilateral    Family History History reviewed. No pertinent family history.  Social History Social History   Tobacco Use  . Smoking status: Never Smoker  . Smokeless tobacco: Never Used  Substance Use Topics  . Alcohol use: No  . Drug use: No   Allergies Patient has no known allergies.  Review of Systems Review of Systems  Constitutional: Positive for fever.  HENT: Positive for congestion and rhinorrhea. Negative for sinus pain and sore throat.   Respiratory: Positive for cough.   Gastrointestinal: Negative for abdominal pain, diarrhea, nausea and vomiting.    Physical Exam Vital Signs  I have reviewed the triage vital signs BP (!) 117/92 (BP Location: Right Arm)   Pulse 96   Temp 98.9 F (37.2 C) (Oral)   Resp 18   Ht 5\' 6"  (1.676 m)   Wt 68 kg (150 lb)   LMP 08/09/2017 (Exact Date)   SpO2 100%  BMI 24.21 kg/m   Physical Exam  Constitutional: She is oriented to person, place, and time. She appears well-developed and well-nourished. No distress.  HENT:  Head: Normocephalic and atraumatic.  Nose: Mucosal edema and rhinorrhea present.  Mouth/Throat: Posterior oropharyngeal erythema (mild with PND) present. No tonsillar exudate.  Eyes: Conjunctivae and EOM are normal. Pupils are equal, round, and reactive to light. Right eye exhibits no discharge. Left eye exhibits no discharge. No scleral icterus.  Neck: Normal range of motion. Neck supple.  Cardiovascular: Normal rate and regular rhythm. Exam reveals no gallop and no friction rub.  No murmur heard. Pulmonary/Chest: Effort normal and breath sounds normal. No stridor. No respiratory distress. She has no rales.  Abdominal: Soft. She exhibits no distension. There is no tenderness.  Musculoskeletal: She exhibits no  edema or tenderness.  Neurological: She is alert and oriented to person, place, and time.  Skin: Skin is warm and dry. No rash noted. She is not diaphoretic. No erythema.  Psychiatric: She has a normal mood and affect.  Vitals reviewed.   ED Results and Treatments Labs (all labs ordered are listed, but only abnormal results are displayed) Labs Reviewed  RAPID STREP SCREEN (NOT AT [facility])  CULTURE, GROUP A STREP ([facility])  CBC WITH DIFFERENTIAL/PLATELET  CBC WITH DIFFERENTIAL/PLATELET  COMPREHENSIVE METABOLIC PANEL  I-STAT BETA HCG BLOOD, ED ([facility], [facility], [facility] ONLY)                                                                                                                         EKG  EKG Interpretation  Date/Time:    Ventricular Rate:    PR Interval:    QRS Duration:   QT Interval:    QTC Calculation:   R Axis:     Text Interpretation:        Radiology Dg Chest 2 View  Result Date: 08/30/2017 CLINICAL DATA:  Dry cough, headache and fever for the past 3 days. Palpitations today. EXAM: CHEST  2 VIEW COMPARISON:  09/22/2015. FINDINGS: Normal sized heart. Clear lungs with normal vascularity. Mild central peribronchial thickening. Normal appearing bones. IMPRESSION: Mild bronchitic changes. Electronically Signed   By: Beckie Salts M.D.   On: 08/30/2017 14:25   Pertinent labs & imaging results that were available during my care of the patient were reviewed by me and considered in my medical decision making (see chart for details).  Medications Ordered in ED Medications  ibuprofen (ADVIL,MOTRIN) tablet 600 mg (not administered)  acetaminophen (TYLENOL) tablet 650 mg (650 mg Oral Given 08/30/17 1407)  Procedures Procedures  (including critical care time)  Medical Decision Making / ED Course I have reviewed the nursing notes for this encounter and the  patient's prior records (if available in EHR or on provided paperwork).    22 y.o. female presents with cough, rhinorrhea, fever for 4 days. adequate oral hydration. Rest of history as above.  Patient appears well. No signs of toxicity, patient is interactive and playful. No hypoxia, tachypnea or other signs of respiratory distress. No sign of clinical dehydration. Lung exam clear. Rest of exam as above.  Chest x-ray with bronchitic changes no evidence of pneumonia.  No evidence of pharyngitis.  Rapid strep in triage was negative.  Screening labs were obtained in triage and currently pending.  Most consistent with viral upper respiratory infection.   No evidence suggestive of pharyngitis, AOM, PNA, or meningitis.    Discussed symptomatic treatment with the patient and they will follow closely with their PCP.   They requested to leave prior to lab results.  Do not feel that keeping her here at this time for lab results is necessary.  Will follow up on labs and contact if needed.  Final Clinical Impression(s) / ED Diagnoses Final diagnoses:  Fever in other diseases  Viral illness    Disposition: Discharge  Condition: Good   Patient mother left prior to receiving discharge paperwork.  ED Discharge Orders    None       Follow Up: Primary care provider  Schedule an appointment as soon as possible for a visit  in 3-5 days, If symptoms do not improve or  worsen     This chart was dictated using voice recognition software.  Despite best efforts to proofread,  errors can occur which can change the documentation meaning.   Nira Conn, MD 08/30/17 2001  Labs have not resulted as it appears that the samples adequate.  Patient has a likely viral process and do not feel that labs would provide any additional information at this time.     Nira Conn, MD 08/31/17 1139

## 2017-08-30 NOTE — ED Provider Notes (Signed)
Patient placed in Quick Look pathway, seen and evaluated   Chief Complaint: fever, congestion  HPI:   Otherwise healthy 22 y.o F presents with 5 days hx of cough, congestion and fever. Was seen on Tuesday and had negative flu test but continues to have fever. Has had some intermittent chespt pains and trouble breathing. Scratchy throat. Works at a hospital.   ROS: positive for sore throat, cough, congestion, fever, chills (one) Negative r dysuria, abdominal pain, diarrhea  Physical Exam:   Gen: Appears uncomfortable, tachycardic and febrile. No apparent distress however  Neuro: Awake and Alert  Skin: Warm     Focused Exam: HEENT: Right TM unale to visualize due to cerumen, Left TM normal, throat erythematous, no exudate. Maxillary sinus tenderness RESP: CTAB  Will order, CXR, strep, CBC, BMP. Tylenol given for fever.   Initiation of care has begun. The patient has been counseled on the process, plan, and necessity for staying for the completion/evaluation, and the remainder of the medical screening examination    Dub MikesDowless, Samantha Tripp, PA-C 08/30/17 1413

## 2017-08-30 NOTE — ED Triage Notes (Signed)
Pt endorses fever/uri sx x 3 days. Went to [facility] and was negative for the flu. 102.9 oral temp in triage. Other VSS.

## 2017-08-30 NOTE — ED Notes (Signed)
No lab draw,  Pt not at location.

## 2017-08-30 NOTE — Discharge Instructions (Signed)
You may take over-the-counter medicine for symptomatic relief, such as Tylenol, Motrin, TheraFlu, Alka seltzer , black elderberry, etc. Please limit acetaminophen (Tylenol) to 4000 mg and Ibuprofen (Motrin, Advil, etc.) to 2400 mg for a 24hr period. Please note that other over-the-counter medicine may contain acetaminophen or ibuprofen as a component of their ingredients.   

## 2017-09-01 LAB — CULTURE, GROUP A STREP (THRC)

## 2017-10-28 ENCOUNTER — Emergency Department (HOSPITAL_COMMUNITY)
Admission: EM | Admit: 2017-10-28 | Discharge: 2017-10-29 | Disposition: A | Discharge status: Home / Self Care | Payer: BLUE CROSS/BLUE SHIELD | Attending: Emergency Medicine | Admitting: Emergency Medicine

## 2017-10-28 ENCOUNTER — Encounter (HOSPITAL_COMMUNITY): Payer: Self-pay | Admitting: Emergency Medicine

## 2017-10-28 ENCOUNTER — Emergency Department (HOSPITAL_COMMUNITY): Payer: BLUE CROSS/BLUE SHIELD

## 2017-10-28 ENCOUNTER — Other Ambulatory Visit: Payer: Self-pay

## 2017-10-28 DIAGNOSIS — R079 Chest pain, unspecified: Secondary | ICD-10-CM | POA: Diagnosis not present

## 2017-10-28 DIAGNOSIS — J45909 Unspecified asthma, uncomplicated: Secondary | ICD-10-CM | POA: Insufficient documentation

## 2017-10-28 DIAGNOSIS — Z79899 Other long term (current) drug therapy: Secondary | ICD-10-CM | POA: Diagnosis not present

## 2017-10-28 LAB — D-DIMER, QUANTITATIVE: D-Dimer, Quant: 0.37 ug/mL-FEU (ref 0.00–0.50)

## 2017-10-28 LAB — BASIC METABOLIC PANEL
Anion gap: 8 (ref 5–15)
BUN: 9 mg/dL (ref 6–20)
CHLORIDE: 105 mmol/L (ref 101–111)
CO2: 23 mmol/L (ref 22–32)
Calcium: 9 mg/dL (ref 8.9–10.3)
Creatinine, Ser: 0.82 mg/dL (ref 0.44–1.00)
GFR calc Af Amer: >60 mL/min (ref >60)
GFR calc non Af Amer: >60 mL/min (ref >60)
GLUCOSE: 97 mg/dL (ref 65–99)
POTASSIUM: 3.3 mmol/L — AB (ref 3.5–5.1)
Sodium: 136 mmol/L (ref 135–145)

## 2017-10-28 LAB — CBC
HEMATOCRIT: 36.8 % (ref 36.0–46.0)
HEMOGLOBIN: 11.8 g/dL — AB (ref 12.0–15.0)
MCH: 27 pg (ref 26.0–34.0)
MCHC: 32.1 g/dL (ref 30.0–36.0)
MCV: 84.2 fL (ref 78.0–100.0)
Platelets: 234 10*3/uL (ref 150–400)
RBC: 4.37 MIL/uL (ref 3.87–5.11)
RDW: 12.4 % (ref 11.5–15.5)
WBC: 8.5 10*3/uL (ref 4.0–10.5)

## 2017-10-28 MED ORDER — METHOCARBAMOL 500 MG PO TABS: 500.0000 mg | ORAL_TABLET | Freq: Every evening | ORAL | 0 refills | Status: AC | PRN

## 2017-10-28 MED ORDER — IBUPROFEN 200 MG PO TABS
600.0000 mg | ORAL_TABLET | Freq: Once | ORAL | Status: AC
  Administered 2017-10-28: 22:00:00 600 mg via ORAL
  Filled 2017-10-28: qty 1

## 2017-10-28 MED ORDER — POTASSIUM CHLORIDE CRYS ER 20 MEQ PO TBCR
40.0000 meq | EXTENDED_RELEASE_TABLET | Freq: Once | ORAL | Status: AC
  Administered 2017-10-28: 40 meq via ORAL
  Filled 2017-10-28: qty 2

## 2017-10-28 MED ORDER — NAPROXEN 500 MG PO TABS: 500.0000 mg | ORAL_TABLET | Freq: Two times a day (BID) | ORAL | 0 refills | Status: AC

## 2017-10-28 NOTE — Discharge Instructions (Signed)
As discussed, the medicine prescribed can help with muscle spasm but cannot be taken if driving, with alcohol or operating machinery. Take at night time as needed. Take naproxen twice a day with food, warm compress and massage and follow up with your Primary care provider this week.   Return to the ER if worsening or new concerning symptoms in the meantime.

## 2017-10-28 NOTE — ED Provider Notes (Signed)
Patient placed in Quick Look pathway, seen and evaluated   Chief Complaint: Chest Pain  HPI:   22 y.o. F who presents for evaluation of left-sided chest pain that is been ongoing for the last 4-5 days.  Patient reports that she did have a heavy exercise routine 1 day prior to onset of symptoms.  Patient reports that pain is worsened with movement, movement of her upper extremities palpation of the area.  She denies any associated nausea, vomiting, diaphoresis, difficulty breathing.  Patient states that she has been evaluated by cardiology for palpitations and has had to wear a Holter monitor.  She reports she has no official diagnosis of any cardiac abnormalities.  She states nobody in her family has died young of heart attacks.  Patient denies any smoking, cocaine, heroin, marijuana use.  ROS: Chest Pain  Physical Exam:   Gen: No distress  Neuro: Awake and Alert  Skin: Warm    Focused Exam: Tenderness palpation noted to the anterior chest wall on the left side.  No deformity or crepitus noted.  Pain is reproduced with palpation and movement of the upper extremities.  Lungs clear to auscultation bilaterally.   Initiation of care has begun. The patient has been counseled on the process, plan, and necessity for staying for the completion/evaluation, and the remainder of the medical screening examination    Rosana HoesLayden, Reynol Arnone A, PA-C 10/28/17 1942    Jacalyn LefevreHaviland, Julie, MD 10/28/17 2000

## 2017-10-28 NOTE — ED Notes (Signed)
Acuity 4; see provider note for assessment

## 2017-10-28 NOTE — ED Triage Notes (Signed)
Pt c/o left sided chest pain with movement after working out on Wednesday.

## 2017-10-28 NOTE — ED Provider Notes (Signed)
[facility] EMERGENCY DEPARTMENT Provider Note   CSN: [id number] Arrival date & time: 10/28/17  1716     History   Chief Complaint Chief Complaint  Patient presents with  . Chest Pain    HPI Kathleen Arellano is a 22 y.o. female with past medical history significant for exercise-induced asthma presenting with 2 days of constant nonradiating left-sided chest pain scribed as a dull pain with intermittent stabbing pains with deep breaths.  Patient explains that she was working out her upper body approximately 4 or 5 days ago and thought it may be related but she did not experience pain for 3 days after her workout.  Her pain is aggravated by movement and palpation as well as deep breaths.  She denies any associated shortness of breath, nausea, vomiting.  She has had palpitations for many years and has seen a cardiologist and wore a Holter monitor but reports that she was unable to get a diagnosis as the palpitations were too short-lived to press the button to correlate. Patient is currently on oral contraceptives, denies history of DVT/PE, prolonged immobilization, lower extremity edema or pain, recent surgeries, recent travels, cough, hemoptysis.  HPI  Past Medical History:  Diagnosis Date  . Asthma    exercise induced    There are no active problems to display for this patient.   Past Surgical History:  Procedure Laterality Date  . WISDOM TOOTH EXTRACTION Bilateral      OB History   None      Home Medications    Prior to Admission medications   Medication Sig Start Date End Date Taking? Authorizing Provider  albuterol (PROVENTIL HFA;VENTOLIN HFA) 108 (90 BASE) MCG/ACT inhaler Inhale 1-2 puffs into the lungs every 6 (six) hours as needed for wheezing or shortness of breath.    [provider]  ibuprofen (ADVIL,MOTRIN) 200 MG tablet Take 400 mg by mouth every 6 (six) hours as needed for headache, mild pain or moderate pain.    [provider]  methocarbamol (ROBAXIN) 500 MG tablet Take 1 tablet (500 mg total) by mouth at bedtime as needed. 10/28/17   Mathews Robinsons B, PA-C  naproxen (NAPROSYN) 500 MG tablet Take 1 tablet (500 mg total) by mouth 2 (two) times daily. 10/28/17   Georgiana Shore, PA-C  norgestimate-ethinyl estradiol (ORTHO-CYCLEN,SPRINTEC,PREVIFEM) 0.25-35 MG-MCG tablet Take 1 tablet by mouth daily.    [provider]  Nutritional Supplements (COLD AND FLU PO) Take 30 mLs by mouth at bedtime as needed.    [provider]  ondansetron (ZOFRAN ODT) 4 MG disintegrating tablet Take 1 tablet (4 mg total) by mouth every 8 (eight) hours as needed for nausea or vomiting. Patient not taking: Reported on 09/05/2016 08/21/16   Dorena Bodo, NP    Family History No family history on file.  Social History Social History   Tobacco Use  . Smoking status: Never Smoker  . Smokeless tobacco: Never Used  Substance Use Topics  . Alcohol use: No  . Drug use: No     Allergies   Patient has no known allergies.   Review of Systems Review of Systems  Constitutional: Negative for chills, diaphoresis, fatigue and fever.  HENT: Negative for ear pain.   Respiratory: Negative for cough, choking, chest tightness, shortness of breath, wheezing and stridor.   Cardiovascular: Positive for chest pain. Negative for palpitations and leg swelling.  Gastrointestinal: Negative for abdominal distention, abdominal pain, nausea and vomiting.  Musculoskeletal: Positive for myalgias.  Negative for arthralgias, back pain, gait problem, joint swelling, neck pain and neck stiffness.  Skin: Negative for color change, pallor, rash and wound.  Neurological: Negative for dizziness, seizures, syncope, weakness, light-headedness and headaches.     Physical Exam Updated Vital Signs BP 124/80 (BP Location: Right Arm)   Pulse 72   Temp 98.7 F (37.1 C) (Oral)   Resp 20   LMP 10/28/2017   SpO2 100%   Physical  Exam  Constitutional: She is oriented to person, place, and time. She appears well-developed and well-nourished.  Non-toxic appearance. She does not appear ill. No distress.  Afebrile, nontoxic-appearing, sitting comfortably in chair in no acute distress.  HENT:  Head: Normocephalic and atraumatic.  Eyes: Conjunctivae and EOM are normal.  Neck: Normal range of motion. Neck supple.  Cardiovascular: Normal rate, regular rhythm, intact distal pulses and normal pulses.  No murmur heard. Pulmonary/Chest: Effort normal and breath sounds normal. No accessory muscle usage or stridor. No tachypnea. No respiratory distress. She has no decreased breath sounds. She has no wheezes. She has no rhonchi. She has no rales.  Tender to palpation of the left chest wall  Musculoskeletal: Normal range of motion. She exhibits no edema.       Right lower leg: Normal. She exhibits no tenderness and no edema.       Left lower leg: Normal. She exhibits no tenderness and no edema.  Neurological: She is alert and oriented to person, place, and time. She is not disoriented.  Skin: Skin is warm and dry. No rash noted. She is not diaphoretic. No erythema. No pallor.  Psychiatric: She has a normal mood and affect.  Nursing note and vitals reviewed.    ED Treatments / Results  Labs (all labs ordered are listed, but only abnormal results are displayed) Labs Reviewed  CBC - Abnormal; Notable for the following components:      Result Value   Hemoglobin 11.8 (*)    All other components within normal limits  BASIC METABOLIC PANEL - Abnormal; Notable for the following components:   Potassium 3.3 (*)    All other components within normal limits  D-DIMER, QUANTITATIVE (NOT [facility])  I-STAT TROPONIN, ED    EKG EKG Interpretation  Date/Time:  Monday October 28 2017 21:46:58 EDT Ventricular Rate:  64 PR Interval:    QRS Duration: 88 QT Interval:  360 QTC Calculation: 372 R Axis:   169 Text Interpretation:  Sinus  rhythm Low voltage, extremity leads Consider right ventricular hypertrophy Repol abnrm suggests ischemia, lateral leads new t wave inversions V5 and V6 Confirmed by Jacalyn LefevreHaviland, Julie [phone]([id number]) on 10/28/2017 11:28:21 PM   Radiology Dg Chest 2 View  Result Date: 10/28/2017 CLINICAL DATA:  LEFT chest pain after working out on 5 days ago. EXAM: CHEST - 2 VIEW COMPARISON:  Chest radiograph August 30, 2017 FINDINGS: Cardiomediastinal silhouette is normal. No pleural effusions or focal consolidations. Trachea projects midline and there is no pneumothorax. Soft tissue planes and included osseous structures are non-suspicious. IMPRESSION: Normal chest. Electronically Signed   By: Awilda Metroourtnay  Bloomer M.D.   On: 10/28/2017 20:48    Procedures Procedures (including critical care time)  Medications Ordered in ED Medications  ibuprofen (ADVIL,MOTRIN) tablet 600 mg (600 mg Oral Given 10/28/17 2208)  potassium chloride SA (K-DUR,KLOR-CON) CR tablet 40 mEq (40 mEq Oral Given 10/28/17 2255)     Initial Impression / Assessment and Plan / ED Course  I have reviewed the triage vital signs and the nursing  notes.  Pertinent labs & imaging results that were available during my care of the patient were reviewed by me and considered in my medical decision making (see chart for details).    Very low suspicion for PE but cannot use PERC due to oral contraceptives.  She is a non-smoker and has no other risk factors. Reassuring vital signs with normal heart rate and pulse ox 100% on room air. Negative dimer  Negative troponin and unremarkable labs.  Negative chest x-ray.  EKG with changes in V5 and V6 since last tracing, T-wave inversions. Troponin negative. Low suspicion for ACS in this patient.  Patient discussed with Dr. Particia Nearing.  Most likely cause of patient's symptoms musculoskeletal.  Pain is reproducible with movement and palpation started a few days after working out her upper body.  Will discharge home with  symptomatic relief and close follow-up with PCP and cardiology.  Discussed strict return precautions and advised to return to the emergency department if experiencing any new or worsening symptoms. Instructions were understood and patient agreed with discharge plan. Final Clinical Impressions(s) / ED Diagnoses   Final diagnoses:  Nonspecific chest pain    ED Discharge Orders        Ordered    methocarbamol (ROBAXIN) 500 MG tablet  At bedtime PRN     10/28/17 2332    naproxen (NAPROSYN) 500 MG tablet  2 times daily     10/28/17 2332       Gregary Cromer 10/29/17 Juventino Slovak, MD 10/29/17 [phone]

## 2017-10-29 LAB — I-STAT TROPONIN, ED: TROPONIN I, POC: 0 ng/mL (ref 0.00–0.08)

## 2018-08-22 IMAGING — DX DG CHEST 2V
2 series · 2 of 2 positions shown · non-contrast
Comparison: 09/22/2015.

CLINICAL DATA: Dry cough, headache and fever for the past 3 days.
Palpitations today.

EXAM:
CHEST  2 VIEW

[chest pa]
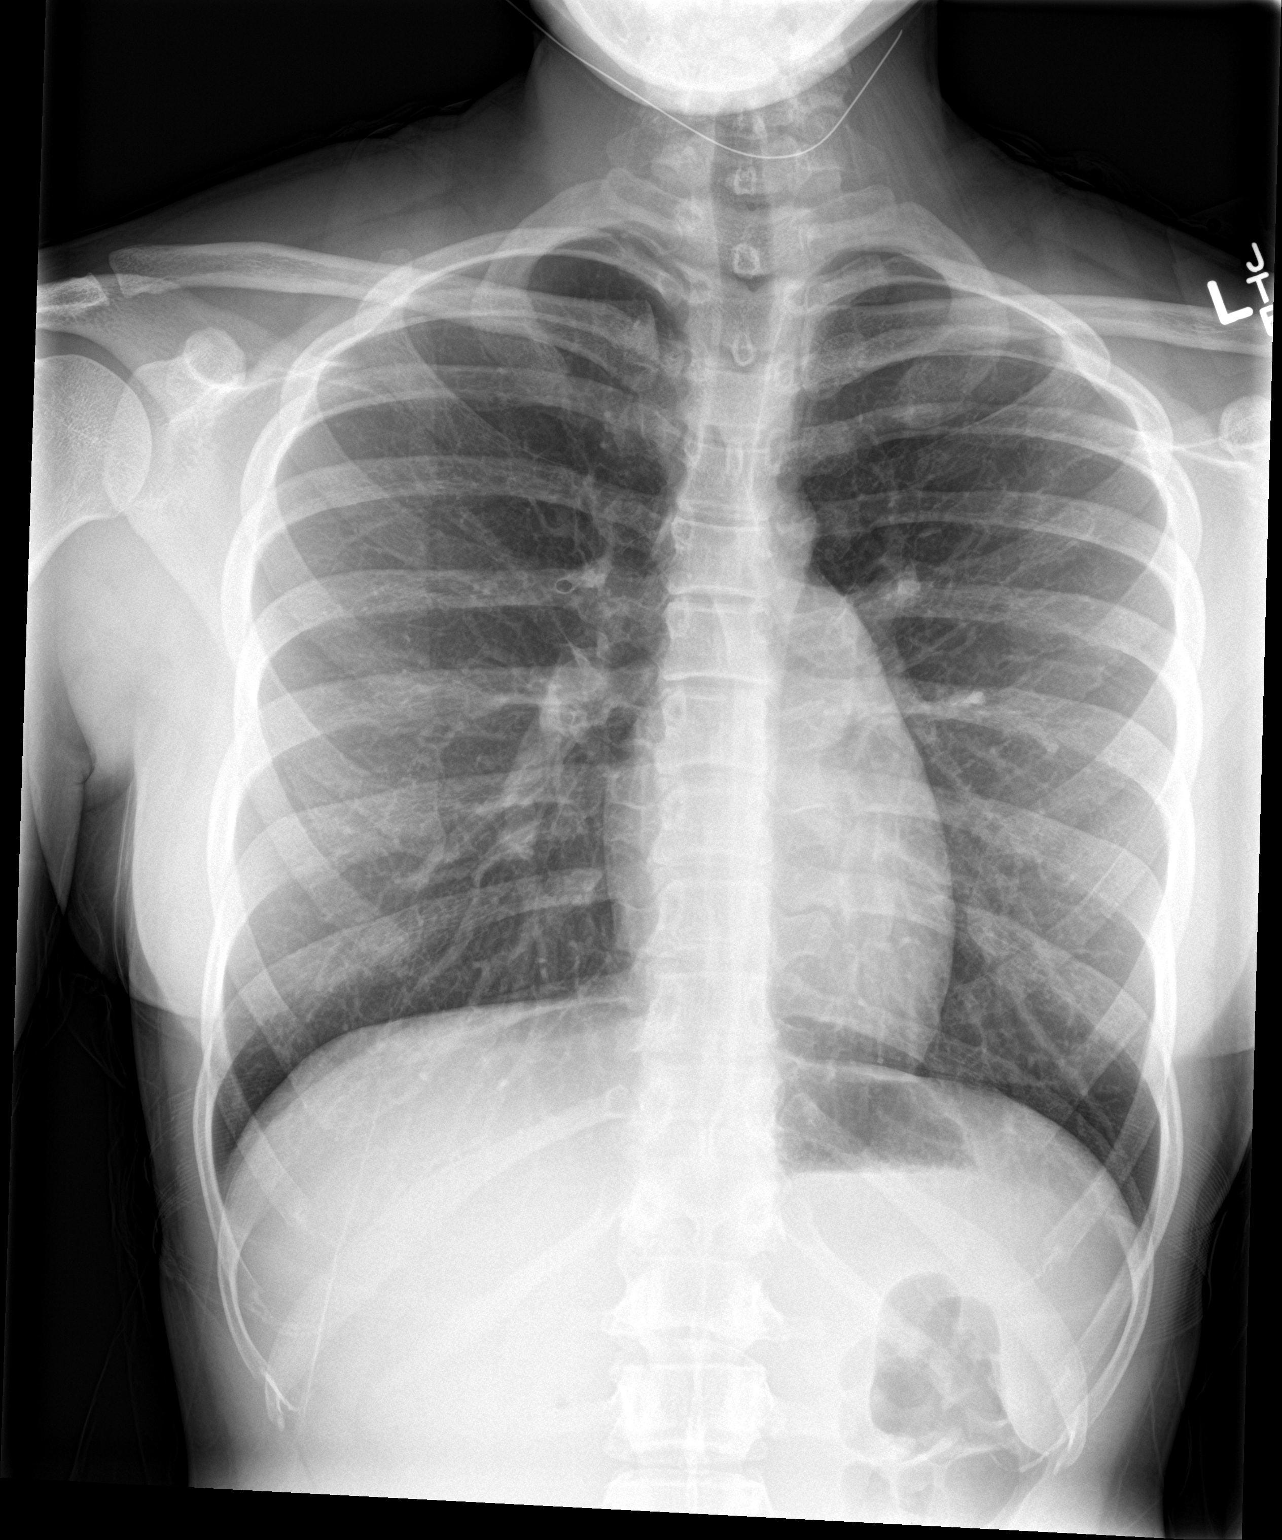

[chest lat]
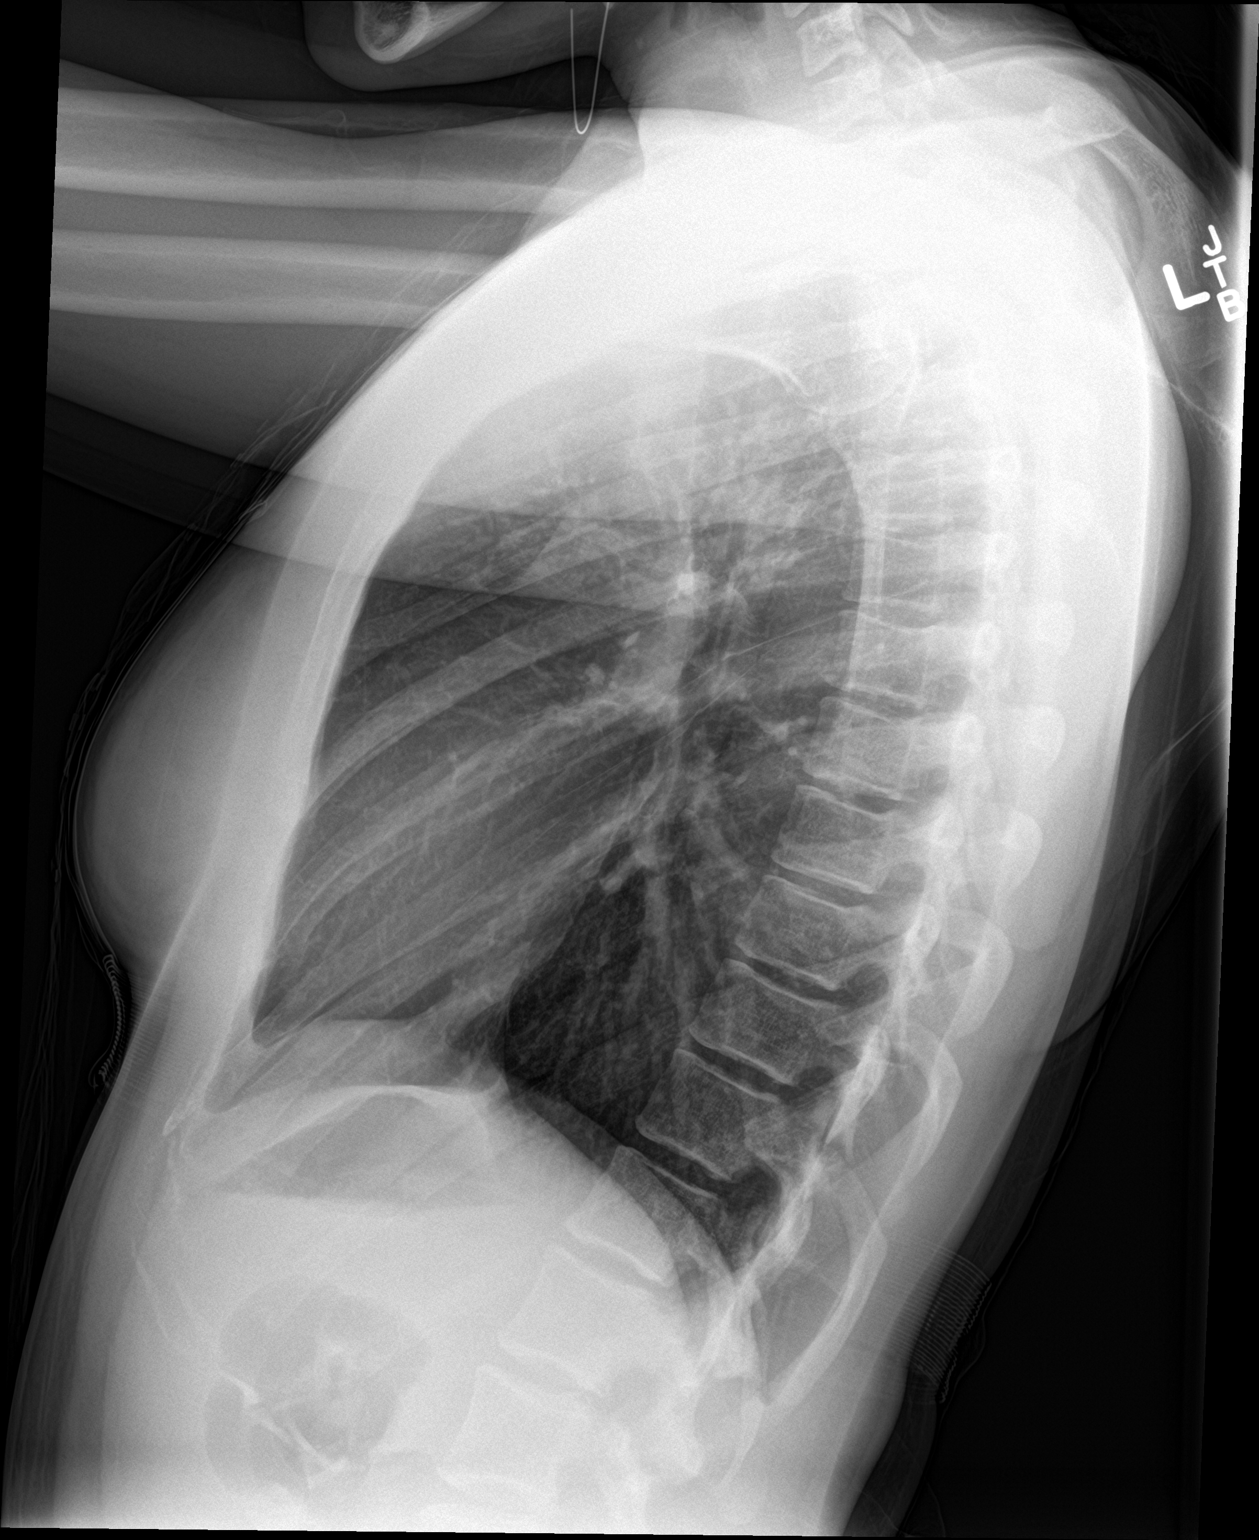

[2 of 2 positions shown; findings below may reference images not displayed]

FINDINGS: Normal sized heart. Clear lungs with normal vascularity. Mild
central peribronchial thickening. Normal appearing bones.
IMPRESSION: Mild bronchitic changes.
